# Patient Record
Sex: Male | Born: 1980
Health system: Southern US, Community
[De-identification: ages and names within clinical notes are randomized; demographics above are authoritative.]

## PROBLEM LIST (undated history)

## (undated) DIAGNOSIS — R161 Splenomegaly, not elsewhere classified: Secondary | ICD-10-CM

## (undated) DIAGNOSIS — K219 Gastro-esophageal reflux disease without esophagitis: Secondary | ICD-10-CM

## (undated) DIAGNOSIS — D696 Thrombocytopenia, unspecified: Secondary | ICD-10-CM

## (undated) DIAGNOSIS — K5790 Diverticulosis of intestine, part unspecified, without perforation or abscess without bleeding: Secondary | ICD-10-CM

## (undated) DIAGNOSIS — K589 Irritable bowel syndrome without diarrhea: Secondary | ICD-10-CM

## (undated) HISTORY — DX: Irritable bowel syndrome without diarrhea: K58.9

## (undated) HISTORY — DX: Thrombocytopenia, unspecified: D69.6

## (undated) HISTORY — DX: Splenomegaly, not elsewhere classified: R16.1

## (undated) HISTORY — PX: COSMETIC SURGERY: SHX468

## (undated) HISTORY — PX: NEVUS EXCISION: SHX2090

## (undated) HISTORY — DX: Gastro-esophageal reflux disease without esophagitis: K21.9

## (undated) HISTORY — DX: Diverticulosis of intestine, part unspecified, without perforation or abscess without bleeding: K57.90

---

## 2002-08-31 ENCOUNTER — Emergency Department (HOSPITAL_COMMUNITY): Admission: EM | Admit: 2002-08-31 | Discharge: 2002-09-01 | Payer: Self-pay | Admitting: Emergency Medicine

## 2002-09-01 ENCOUNTER — Encounter: Payer: Self-pay | Admitting: Emergency Medicine

## 2002-09-10 ENCOUNTER — Emergency Department (HOSPITAL_COMMUNITY): Admission: EM | Admit: 2002-09-10 | Discharge: 2002-09-10 | Payer: Self-pay | Admitting: Emergency Medicine

## 2006-12-15 HISTORY — PX: WISDOM TOOTH EXTRACTION: SHX21

## 2007-01-01 ENCOUNTER — Ambulatory Visit: Payer: Self-pay | Admitting: Internal Medicine

## 2007-01-02 LAB — CONVERTED CEMR LAB
LDL Cholesterol: 129 mg/dL — ABNORMAL HIGH (ref 0–99)
VLDL: 12 mg/dL (ref 0–40)

## 2009-02-17 ENCOUNTER — Ambulatory Visit: Payer: Self-pay | Admitting: Internal Medicine

## 2010-03-13 ENCOUNTER — Ambulatory Visit: Payer: Self-pay | Admitting: Internal Medicine

## 2010-03-13 DIAGNOSIS — K219 Gastro-esophageal reflux disease without esophagitis: Secondary | ICD-10-CM | POA: Insufficient documentation

## 2010-05-15 NOTE — Assessment & Plan Note (Signed)
Summary: CPX/CLE   Vital Signs:  Patient profile:   30 year old male Weight:      199 pounds BMI:     26.35 Temp:     98.0 degrees F oral Pulse rate:   72 / minute Pulse rhythm:   regular BP sitting:   100 / 60  (left arm) Cuff size:   large  Vitals Entered By: Mervin Hack CMA Duncan Dull) (March 13, 2010 8:41 AM) CC: adult physical   History of Present Illness: Here for physical  No new concerns Still at same job but new diviision  Physically active at work does try to exercise some  Allergies: No Known Drug Allergies  Past History:  Past medical, surgical, family and social histories (including risk factors) reviewed for relevance to current acute and chronic problems.  Past Medical History: GERD  Past Surgical History: Reviewed history from 01/01/2007 and no changes required. Wisdom teeth removed--9/08 Plastic surgery on face as a child--dog bite  Family History: Reviewed history from 01/01/2007 and no changes required. Mom died @53  MI Dad with CABG, DM 1 brother  1 sister CAD in mat GF also No HTN Pat GF died of some cancer  Social History: Reviewed history from 01/01/2007 and no changes required. 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 Eyes:  Denies double vision and vision loss-1 eye. ENT:  Denies decreased hearing and ringing in ears; teeth okay--generally keeps up with dentist. CV:  Denies chest pain or discomfort, difficulty breathing at night, difficulty breathing while lying down, fainting, lightheadness, palpitations, and shortness of breath with exertion. Resp:  Denies cough and shortness of breath. GI:  Complains of hemorrhoids and indigestion; denies abdominal pain, bloody stools, change in bowel habits, dark tarry stools, nausea, and vomiting; occ heartburn depending on how he eats---uses ranitidine as needed  . GU:  Denies erectile dysfunction, urinary frequency, and urinary hesitancy. MS:  Denies joint pain and joint swelling. Derm:  Denies lesion(s) and rash. Neuro:  Complains of headaches; denies numbness, tingling, and weakness; occ headaches--like once a month. Sinus meds usually help. Psych:  Denies anxiety and depression. Heme:  Denies abnormal bruising and enlarge lymph nodes. Allergy:  Denies seasonal allergies and sneezing.  Physical Exam  General:  alert and normal appearance.   Eyes:  pupils equal, pupils round, pupils reactive to light, and no optic disk abnormalities.   Ears:  R ear normal and L ear normal.   Mouth:  no erythema, no exudates, and no lesions.   Neck:  supple, no masses, no thyromegaly, no carotid bruits, and no cervical lymphadenopathy.   Lungs:  normal respiratory effort, no intercostal retractions, no accessory muscle use, and normal breath sounds.   Heart:  normal rate, regular rhythm, no murmur, and no gallop.   Abdomen:  soft, non-tender, and no masses.   Genitalia:  no scrotal masses, no testicular masses or atrophy, and no cutaneous lesions.   Msk:  no joint tenderness and no joint swelling.   Pulses:  normal in feet Extremities:  no edema Neurologic:  alert & oriented X3, strength normal in all extremities, and gait normal.   Skin:  no rashes and no suspicious lesions.   multiple benign nevi Axillary Nodes:  No palpable lymphadenopathy Psych:  normally interactive, good eye contact, not anxious appearing, and not depressed appearing.     Impression & Recommendations:  Problem # 1:  PREVENTIVE HEALTH CARE (ICD-V70.0) Assessment Comment Only healthy no labs---will check at health screening at work (and chol already okay) discussed fitness  Problem # 2:  GERD (ICD-530.81) Assessment: Unchanged does fine with occ ranitidine  Other Orders: Admin 1st Vaccine (64403) Flu Vaccine 29yrs + (47425)  Patient Instructions: 1)  Please schedule a  follow-up appointment in 1-2 years    Orders Added: 1)  Admin 1st Vaccine [90471] 2)  Flu Vaccine 56yrs + [95638] 3)  Est. Patient 18-39 years [99395]    Prior Medications: None Current Allergies (reviewed today): No known allergies Flu Vaccine Consent Questions     Do you have a history of severe allergic reactions to this vaccine? no    Any prior history of allergic reactions to egg and/or gelatin? no    Do you have a sensitivity to the preservative Thimersol? no    Do you have a past history of Guillan-Barre Syndrome? no    Do you currently have an acute febrile illness? no    Have you ever had a severe reaction to latex? no    Vaccine information given and explained to patient? yes    Are you currently pregnant? no    Lot Number:[id number]   Exp Date:10/13/2010   Site Given  Left Deltoid IMallergies     .[email or web address]

## 2011-08-20 ENCOUNTER — Other Ambulatory Visit: Payer: Self-pay

## 2011-08-26 ENCOUNTER — Encounter: Payer: Self-pay | Admitting: Internal Medicine

## 2011-08-27 ENCOUNTER — Ambulatory Visit (INDEPENDENT_AMBULATORY_CARE_PROVIDER_SITE_OTHER): Payer: BC Managed Care – PPO | Admitting: Internal Medicine

## 2011-08-27 ENCOUNTER — Encounter: Payer: Self-pay | Admitting: Internal Medicine

## 2011-08-27 VITALS — BP 118/70 | HR 66 | Temp 98.3°F | Ht 73.0 in | Wt 202.0 lb

## 2011-08-27 DIAGNOSIS — Z0000 Encounter for general adult medical examination without abnormal findings: Secondary | ICD-10-CM

## 2011-08-27 LAB — LIPID PANEL
Cholesterol: 180 mg/dL (ref 0–200)
HDL: 39.5 mg/dL (ref >39.00)
LDL Cholesterol: 123 mg/dL — ABNORMAL HIGH (ref 0–99)
Total CHOL/HDL Ratio: 5
Triglycerides: 86 mg/dL (ref 0.0–149.0)
VLDL: 17.2 mg/dL (ref 0.0–40.0)

## 2011-08-27 NOTE — Progress Notes (Signed)
Subjective:    Patient ID: Ronald Jones, male    DOB: 24-Feb-1981, 31 y.o.   MRN: [id number]  HPI Doing well Here for physical---job wants health assessment yearly  No changes in social or family history  Does have a bump on right upper thigh Noted about a year ago No sig change No symptoms  No current outpatient prescriptions on file prior to visit.    No Known Allergies  Past Medical History  Diagnosis Date  . GERD (gastroesophageal reflux disease)     Past Surgical History  Procedure Date  . Wisdom tooth extraction 09/08  . Cosmetic surgery     surgery on face as a child, dog bite    Family History  Problem Relation Age of Onset  . Heart disease Mother   . Diabetes Father   . Heart disease Father   . Coronary artery disease Maternal Grandfather   . Cancer Paternal Grandfather     History   Social History  . Marital Status: Single    Spouse Name: N/A    Number of Children: N/A  . Years of Education: N/A   Occupational History  . Not on file.   Social History Main Topics  . Smoking status: Former Smoker    Types: Cigarettes  . Smokeless tobacco: Never Used  . Alcohol Use: Yes  . Drug Use: No  . Sexually Active: Not on file   Other Topics Concern  . Not on file   Social History Narrative  . No narrative on file   Review of Systems  Constitutional: Negative for fatigue and unexpected weight change.       Swims in his pool Weight stable Wears seat belt  HENT: Negative for hearing loss, congestion, rhinorrhea, dental problem and tinnitus.        Overdue for dentist  Eyes: Negative for visual disturbance.       No diplopia or unilateral vision loss  Respiratory: Negative for cough, chest tightness and shortness of breath.   Cardiovascular: Negative for chest pain, palpitations and leg swelling.  Gastrointestinal: Negative for nausea, vomiting, abdominal pain, constipation and blood in stool.       Uses OTC ranitidine every 2 weeks or so for  heartburn  Genitourinary: Negative for dysuria, urgency, frequency and difficulty urinating.       No sexual problems  Musculoskeletal: Negative for back pain, joint swelling and arthralgias.  Skin: Negative for rash.       No suspicious lesions  Neurological: Negative for dizziness, syncope, weakness, light-headedness, numbness and headaches.  Hematological: Negative for adenopathy. Does not bruise/bleed easily.  Psychiatric/Behavioral: Negative for sleep disturbance and dysphoric mood. The patient is not nervous/anxious.        Objective:   Physical Exam  Constitutional: He is oriented to person, place, and time. He appears well-developed and well-nourished. No distress.  HENT:  Head: Normocephalic and atraumatic.  Right Ear: External ear normal.  Left Ear: External ear normal.  Mouth/Throat: Oropharynx is clear and moist. No oropharyngeal exudate.  Eyes: Conjunctivae and EOM are normal. Pupils are equal, round, and reactive to light.  Neck: Normal range of motion. Neck supple. No thyromegaly present.  Cardiovascular: Normal rate, regular rhythm, normal heart sounds and intact distal pulses.  Exam reveals no gallop.   No murmur heard. Pulmonary/Chest: Effort normal and breath sounds normal. No respiratory distress. He has no wheezes. He has no rales.  Abdominal: Soft. There is no tenderness.  Genitourinary:  Testes normal  Musculoskeletal: Normal range of motion. He exhibits no edema and no tenderness.  Lymphadenopathy:    He has no cervical adenopathy.  Neurological: He is alert and oriented to person, place, and time.  Skin: No rash noted. No erythema.       Small cyst on lateral right hip  Psychiatric: He has a normal mood and affect. His behavior is normal.          Assessment & Plan:

## 2011-08-27 NOTE — Assessment & Plan Note (Signed)
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

## 2011-08-28 ENCOUNTER — Encounter: Payer: Self-pay | Admitting: *Deleted

## 2014-06-24 LAB — LIPID PANEL
CHOLESTEROL: 173 mg/dL (ref 0–200)
HDL: 40 mg/dL (ref 35–70)
LDL CALC: 119 mg/dL
TRIGLYCERIDES: 71 mg/dL (ref 40–160)

## 2014-07-20 ENCOUNTER — Ambulatory Visit (INDEPENDENT_AMBULATORY_CARE_PROVIDER_SITE_OTHER): Payer: BLUE CROSS/BLUE SHIELD | Admitting: Internal Medicine

## 2014-07-20 ENCOUNTER — Encounter: Payer: Self-pay | Admitting: Internal Medicine

## 2014-07-20 ENCOUNTER — Encounter (INDEPENDENT_AMBULATORY_CARE_PROVIDER_SITE_OTHER): Payer: Self-pay

## 2014-07-20 VITALS — BP 120/68 | HR 79 | Temp 98.0°F | Ht 73.5 in | Wt 214.0 lb

## 2014-07-20 DIAGNOSIS — Z0000 Encounter for general adult medical examination without abnormal findings: Secondary | ICD-10-CM | POA: Diagnosis not present

## 2014-07-20 NOTE — Progress Notes (Signed)
Pre visit review using our clinic review tool, if applicable. No additional management support is needed unless otherwise documented below in the visit note. 

## 2014-07-20 NOTE — Assessment & Plan Note (Signed)
Healthy Has gained some weight Discussed fitness, etc

## 2014-07-20 NOTE — Progress Notes (Signed)
Subjective:    Patient ID: Ronald Jones, male    DOB: 1981-02-06, 34 y.o.   MRN: [id number]  HPI Here for physical  No new concerns Has baby on way in August--- son Same job  No regular exercise Walks all day at work  No current outpatient prescriptions on file prior to visit.   No current facility-administered medications on file prior to visit.    No Known Allergies  Past Medical History  Diagnosis Date  . GERD (gastroesophageal reflux disease)     Past Surgical History  Procedure Laterality Date  . Wisdom tooth extraction  09/08  . Cosmetic surgery      surgery on face as a child, dog bite  . Nevus excision      2 removed--benign    Family History  Problem Relation Age of Onset  . Heart disease Mother   . Diabetes Father   . Heart disease Father   . Coronary artery disease Maternal Grandfather   . Cancer Paternal Grandfather     History   Social History  . Marital Status: Married    Spouse Name: N/A  . Number of Children: N/A  . Years of Education: N/A   Occupational History  . Mechanic    Social History Main Topics  . Smoking status: Former Smoker    Types: Cigarettes  . Smokeless tobacco: Never Used  . Alcohol Use: Yes  . Drug Use: No  . Sexual Activity: Not on file   Other Topics Concern  . Not on file   Social History Narrative   Review of Systems  Constitutional: Positive for unexpected weight change. Negative for fatigue.       Wears seat belt Weight up 12#  HENT: Negative for dental problem, hearing loss, tinnitus and trouble swallowing.        Overdo for dentist  Eyes: Negative for visual disturbance.       No diplopia or unilateral vision loss  Respiratory: Negative for cough, chest tightness and shortness of breath.   Cardiovascular: Negative for chest pain, palpitations and leg swelling.  Gastrointestinal: Negative for nausea, vomiting, abdominal pain, constipation and blood in stool.       Occasional  indigestion---ranitidine works  Endocrine: Negative for polydipsia and polyuria.  Genitourinary: Negative for urgency, frequency and difficulty urinating.  Musculoskeletal: Negative for joint swelling.       Occasional soreness from work--not enough for meds  Skin: Negative for rash.       Nevi removed  Allergic/Immunologic: Negative for environmental allergies and immunocompromised state.  Neurological: Negative for dizziness, syncope, weakness, light-headedness, numbness and headaches.  Psychiatric/Behavioral: Negative for dysphoric mood. The patient is not nervous/anxious.        Objective:   Physical Exam  Constitutional: He is oriented to person, place, and time. He appears well-developed and well-nourished. No distress.  HENT:  Head: Normocephalic and atraumatic.  Right Ear: External ear normal.  Left Ear: External ear normal.  Mouth/Throat: Oropharynx is clear and moist. No oropharyngeal exudate.  Eyes: Conjunctivae and EOM are normal. Pupils are equal, round, and reactive to light.  Neck: Normal range of motion. Neck supple. No thyromegaly present.  Cardiovascular: Normal rate, regular rhythm, normal heart sounds and intact distal pulses.  Exam reveals no gallop.   No murmur heard. Pulmonary/Chest: Effort normal and breath sounds normal. No respiratory distress. He has no wheezes. He has no rales.  Abdominal: Soft. There is no tenderness.  Musculoskeletal: He exhibits no edema  or tenderness.  Lymphadenopathy:    He has no cervical adenopathy.  Neurological: He is alert and oriented to person, place, and time.  Skin: No rash noted. No erythema.  Psychiatric: He has a normal mood and affect. His behavior is normal.          Assessment & Plan:

## 2015-04-12 ENCOUNTER — Ambulatory Visit (INDEPENDENT_AMBULATORY_CARE_PROVIDER_SITE_OTHER): Payer: BLUE CROSS/BLUE SHIELD | Admitting: Internal Medicine

## 2015-04-12 ENCOUNTER — Encounter: Payer: Self-pay | Admitting: *Deleted

## 2015-04-12 ENCOUNTER — Encounter: Payer: Self-pay | Admitting: Internal Medicine

## 2015-04-12 VITALS — BP 120/80 | HR 88 | Temp 98.0°F | Wt 213.0 lb

## 2015-04-12 DIAGNOSIS — J0100 Acute maxillary sinusitis, unspecified: Secondary | ICD-10-CM

## 2015-04-12 MED ORDER — AMOXICILLIN 500 MG PO TABS: 1000.0000 mg | ORAL_TABLET | Freq: Two times a day (BID) | ORAL | Status: DC

## 2015-04-12 NOTE — Patient Instructions (Signed)
Please start the antibiotic if you are worsening in the next few days. 

## 2015-04-12 NOTE — Progress Notes (Signed)
   Subjective:    Patient ID: Ronald Jones, male    DOB: 01-09-1981, 34 y.o.   MRN: [id number]  HPI Here due to cough  Throat is very painful Lots of mucus in throat--post nasal drip Some cough---green/yellow sputum Started about 5 days ago No clear fever but he felt feverish--cold then sweaty 3 days ago No SOB Some right ear pain yesterday Some headache at first  Tried OTC mucinex cold and sinus-- may have helped a little  No current outpatient prescriptions on file prior to visit.   No current facility-administered medications on file prior to visit.    No Known Allergies  Past Medical History  Diagnosis Date  . GERD (gastroesophageal reflux disease)     Past Surgical History  Procedure Laterality Date  . Wisdom tooth extraction  09/08  . Cosmetic surgery      surgery on face as a child, dog bite  . Nevus excision      2 removed--benign    Family History  Problem Relation Age of Onset  . Heart disease Mother   . Diabetes Father   . Heart disease Father   . Coronary artery disease Maternal Grandfather   . Cancer Paternal Grandfather     Social History   Social History  . Marital Status: Married    Spouse Name: N/A  . Number of Children: N/A  . Years of Education: N/A   Occupational History  . Mechanic    Social History Main Topics  . Smoking status: Former Smoker    Types: Cigarettes  . Smokeless tobacco: Never Used  . Alcohol Use: Yes  . Drug Use: No  . Sexual Activity: Not on file   Other Topics Concern  . Not on file   Social History Narrative   Review of Systems No known strep exposure No rash Vomited and loose stool 3 days ago--better since Appetite is okay    Objective:   Physical Exam  Constitutional: No distress.  HENT:  Bilateral mild maxillary tenderness Moderate nasal inflammation TMs normal Mild pharyngeal injection. No tonsillar enlargement or exudates  Neck: Normal range of motion. Neck supple.  Small non tender  anterior cervical nodes  Pulmonary/Chest: Effort normal and breath sounds normal. No respiratory distress. He has no wheezes. He has no rales.          Assessment & Plan:

## 2015-04-12 NOTE — Assessment & Plan Note (Signed)
May still be viral infection Discussed supportive care If worsens, will fill the amoxil Rx

## 2015-07-06 ENCOUNTER — Ambulatory Visit (INDEPENDENT_AMBULATORY_CARE_PROVIDER_SITE_OTHER): Payer: BLUE CROSS/BLUE SHIELD | Admitting: Internal Medicine

## 2015-07-06 ENCOUNTER — Telehealth: Payer: Self-pay | Admitting: *Deleted

## 2015-07-06 ENCOUNTER — Telehealth: Payer: Self-pay | Admitting: Internal Medicine

## 2015-07-06 ENCOUNTER — Encounter: Payer: Self-pay | Admitting: Internal Medicine

## 2015-07-06 VITALS — BP 126/88 | HR 111 | Temp 98.2°F | Wt 207.0 lb

## 2015-07-06 DIAGNOSIS — A084 Viral intestinal infection, unspecified: Secondary | ICD-10-CM | POA: Diagnosis not present

## 2015-07-06 LAB — COMPREHENSIVE METABOLIC PANEL
ALK PHOS: 55 U/L (ref 39–117)
ALT: 37 U/L (ref 0–53)
AST: 30 U/L (ref 0–37)
Albumin: 4.6 g/dL (ref 3.5–5.2)
BILIRUBIN TOTAL: 1.3 mg/dL — AB (ref 0.2–1.2)
BUN: 18 mg/dL (ref 6–23)
CO2: 24 meq/L (ref 19–32)
CREATININE: 1.28 mg/dL (ref 0.40–1.50)
Calcium: 9.2 mg/dL (ref 8.4–10.5)
Chloride: 99 mEq/L (ref 96–112)
GFR: 67.93 mL/min (ref >60.00)
GLUCOSE: 107 mg/dL — AB (ref 70–99)
Potassium: 2.7 mEq/L — CL (ref 3.5–5.1)
Sodium: 134 mEq/L — ABNORMAL LOW (ref 135–145)
TOTAL PROTEIN: 7.5 g/dL (ref 6.0–8.3)

## 2015-07-06 LAB — CBC
HCT: 44.1 % (ref 39.0–52.0)
HEMOGLOBIN: 15.3 g/dL (ref 13.0–17.0)
MCHC: 34.8 g/dL (ref 30.0–36.0)
MCV: 84.8 fl (ref 78.0–100.0)
PLATELETS: 139 10*3/uL — AB (ref 150.0–400.0)
RBC: 5.2 Mil/uL (ref 4.22–5.81)
RDW: 14.1 % (ref 11.5–15.5)
WBC: 8.8 10*3/uL (ref 4.0–10.5)

## 2015-07-06 NOTE — Progress Notes (Signed)
Pre visit review using our clinic review tool, if applicable. No additional management support is needed unless otherwise documented below in the visit note. 

## 2015-07-06 NOTE — Telephone Encounter (Signed)
noted 

## 2015-07-06 NOTE — Progress Notes (Signed)
Subjective:    Patient ID: Ronald Jones, male    DOB: 02/15/1981, 35 y.o.   MRN: [id number]  HPI  Pt presents to the clinic today with c/o nausea, vomiting and diarrhea. He reports this started 3 days ago. He has not vomited in the last 2 days. He continues to have loose, watery stools. He does have some associated left lower abdominal discomfort but denies fever or blood in his stool. He has had some chills. He has not had contact with anyone with similar symptoms. He denies recent travel or consuming contaminated food and water. He denies changes in diet or medications. He has taken Zofran with minimal relief.  Review of Systems      Past Medical History  Diagnosis Date  . GERD (gastroesophageal reflux disease)     Current Outpatient Prescriptions  Medication Sig Dispense Refill  . ondansetron (ZOFRAN) 8 MG tablet   0   No current facility-administered medications for this visit.    No Known Allergies  Family History  Problem Relation Age of Onset  . Heart disease Mother   . Diabetes Father   . Heart disease Father   . Coronary artery disease Maternal Grandfather   . Cancer Paternal Grandfather     Social History   Social History  . Marital Status: Married    Spouse Name: N/A  . Number of Children: 1  . Years of Education: N/A   Occupational History  . Mechanic    Social History Main Topics  . Smoking status: Former Smoker    Types: Cigarettes  . Smokeless tobacco: Never Used  . Alcohol Use: Yes  . Drug Use: No  . Sexual Activity: Not on file   Other Topics Concern  . Not on file   Social History Narrative   Son born 2016     Constitutional: Denies fever, malaise, fatigue, headache or abrupt weight changes.  Respiratory: Denies difficulty breathing, shortness of breath, cough or sputum production.   Cardiovascular: Denies chest pain, chest tightness, palpitations or swelling in the hands or feet.  Gastrointestinal: Pt reports abdominal pain, nausea,  vomiting and diarrhea. Denies bloating, constipation, or blood in the stool.   No other specific complaints in a complete review of systems (except as listed in HPI above).  Objective:   Physical Exam  BP 126/88 mmHg  Pulse 111  Temp(Src) 98.2 F (36.8 C) (Oral)  Wt 207 lb (93.895 kg)  SpO2 98% Wt Readings from Last 3 Encounters:  07/06/15 207 lb (93.895 kg)  04/12/15 213 lb (96.616 kg)  07/20/14 214 lb (97.07 kg)    General: Appears his stated age, in NAD. Cardiovascular: Tachycardic with normal rhythm. S1,S2 noted.  No murmur, rubs or gallops noted. Pulmonary/Chest: Normal effort and positive vesicular breath sounds. No respiratory distress. No wheezes, rales or ronchi noted.  Abdomen: Soft and nontender. Hypoactive bowel sounds. No distention or masses noted.   BMET    Component Value Date/Time   GLUCOSE 84 08/27/2011 1235    Lipid Panel     Component Value Date/Time   CHOL 173 06/24/2014   TRIG 71 06/24/2014   HDL 40 06/24/2014   CHOLHDL 5 08/27/2011 1235   VLDL 17.2 08/27/2011 1235   LDLCALC 119 06/24/2014    CBC No results found for: WBC, RBC, HGB, HCT, PLT, MCV, MCH, MCHC, RDW, LYMPHSABS, MONOABS, EOSABS, BASOSABS  Hgb A1C No results found for: HGBA1C       Assessment & Plan:   Viral  Gastroenteritis:  Low suspicion for diverticulitis given age, lack of fever or abdominal pain at this time Will check CBC and CMET today Clear liquid diet for now, progress as symptoms improve Ok to continue Zofran as needed Avoid antidiarrheals Work note provided  RTC as needed or if symptoms persist or worsen

## 2015-07-06 NOTE — Telephone Encounter (Signed)
Received call from [facility] lab with critical results. Potassium was 2.7. Hardcopy of labs given to [facility].

## 2015-07-06 NOTE — Patient Instructions (Signed)

## 2015-07-06 NOTE — Telephone Encounter (Signed)
Patient called to find out his lab results.  I let patient know Regina's comments.  He is on my chart, but the labs weren't released.

## 2016-12-19 ENCOUNTER — Encounter: Payer: Self-pay | Admitting: Internal Medicine

## 2016-12-19 ENCOUNTER — Ambulatory Visit (INDEPENDENT_AMBULATORY_CARE_PROVIDER_SITE_OTHER): Payer: BLUE CROSS/BLUE SHIELD | Admitting: Internal Medicine

## 2016-12-19 VITALS — BP 112/70 | HR 80 | Temp 97.5°F | Ht 73.0 in | Wt 208.0 lb

## 2016-12-19 DIAGNOSIS — K589 Irritable bowel syndrome without diarrhea: Secondary | ICD-10-CM | POA: Insufficient documentation

## 2016-12-19 DIAGNOSIS — K580 Irritable bowel syndrome with diarrhea: Secondary | ICD-10-CM

## 2016-12-19 DIAGNOSIS — Z0000 Encounter for general adult medical examination without abnormal findings: Secondary | ICD-10-CM | POA: Diagnosis not present

## 2016-12-19 DIAGNOSIS — Z23 Encounter for immunization: Secondary | ICD-10-CM | POA: Diagnosis not present

## 2016-12-19 MED ORDER — HYOSCYAMINE SULFATE 0.125 MG SL SUBL: 0.1250 mg | SUBLINGUAL_TABLET | Freq: Three times a day (TID) | SUBLINGUAL | 5 refills | Status: DC | PRN

## 2016-12-19 NOTE — Addendum Note (Signed)
Addended by: Pilar Grammes on: 12/19/2016 03:37 PM   Modules accepted: Orders

## 2016-12-19 NOTE — Assessment & Plan Note (Signed)
Not overly troubling Will give hyoscyamine to try when going out

## 2016-12-19 NOTE — Assessment & Plan Note (Signed)
Healthy Will check lipid Discussed fitness Tdap today

## 2016-12-19 NOTE — Progress Notes (Signed)
Subjective:    Patient ID: Ronald Jones, male    DOB: 1980/10/18, 36 y.o.   MRN: [id number]  HPI Here for physical  Feels well Son is now 22 years old  No concerns  No current outpatient prescriptions on file prior to visit.   No current facility-administered medications on file prior to visit.     No Known Allergies  Past Medical History:  Diagnosis Date  . GERD (gastroesophageal reflux disease)     Past Surgical History:  Procedure Laterality Date  . COSMETIC SURGERY     surgery on face as a child, dog bite  . NEVUS EXCISION     2 removed--benign  . WISDOM TOOTH EXTRACTION  09/08    Family History  Problem Relation Age of Onset  . Heart disease Mother   . Diabetes Father   . Heart disease Father   . Coronary artery disease Maternal Grandfather   . Cancer Paternal Grandfather     Social History   Social History  . Marital status: Married    Spouse name: N/A  . Number of children: 1  . Years of education: N/A   Occupational History  . Mechanic    Social History Main Topics  . Smoking status: Former Smoker    Types: Cigarettes  . Smokeless tobacco: Never Used  . Alcohol use Yes  . Drug use: No  . Sexual activity: Not on file   Other Topics Concern  . Not on file   Social History Narrative   Son born 2016   Review of Systems  Constitutional: Negative for fatigue and unexpected weight change.       No recent work outs  Wears seat belt  HENT: Negative for dental problem, hearing loss and tinnitus.        Overdue for dentist  Eyes: Negative for visual disturbance.       No diplopia or unilateral vision loss  Respiratory: Negative for cough, chest tightness and shortness of breath.   Cardiovascular: Negative for chest pain, palpitations and leg swelling.  Gastrointestinal: Negative for abdominal pain and blood in stool.       Gets indigestion and irritable bowel---loose, frequent stools at times  Endocrine: Negative for polydipsia and polyuria.    Genitourinary: Negative for difficulty urinating and urgency.       No sexual problems  Musculoskeletal: Negative for arthralgias and joint swelling.       Uses aleve for back at times  Skin: Negative for rash.  Allergic/Immunologic: Positive for environmental allergies. Negative for immunocompromised state.       Rare zyrtec  Neurological: Negative for dizziness, syncope and light-headedness.       Rare headaches  Hematological: Negative for adenopathy. Does not bruise/bleed easily.  Psychiatric/Behavioral: Negative for dysphoric mood and sleep disturbance. The patient is not nervous/anxious.        Objective:   Physical Exam  Constitutional: He is oriented to person, place, and time. He appears well-developed and well-nourished. No distress.  HENT:  Head: Normocephalic and atraumatic.  Right Ear: External ear normal.  Left Ear: External ear normal.  Mouth/Throat: Oropharynx is clear and moist. No oropharyngeal exudate.  Eyes: Pupils are equal, round, and reactive to light. Conjunctivae are normal.  Neck: Normal range of motion. Neck supple. No thyromegaly present.  Cardiovascular: Normal rate, regular rhythm, normal heart sounds and intact distal pulses.  Exam reveals no gallop.   No murmur heard. Pulmonary/Chest: Effort normal and breath sounds normal. No  respiratory distress. He has no wheezes. He has no rales.  Abdominal: Soft. He exhibits no distension. There is no tenderness. There is no rebound and no guarding.  Musculoskeletal: He exhibits no edema or tenderness.  Lymphadenopathy:    He has no cervical adenopathy.  Neurological: He is alert and oriented to person, place, and time.  Skin: No rash noted. No erythema.  Psychiatric: He has a normal mood and affect. His behavior is normal.          Assessment & Plan:

## 2016-12-20 LAB — CBC WITH DIFFERENTIAL/PLATELET
BASOS PCT: 0.5 % (ref 0.0–3.0)
Basophils Absolute: 0 10*3/uL (ref 0.0–0.1)
EOS ABS: 0.1 10*3/uL (ref 0.0–0.7)
Eosinophils Relative: 2.5 % (ref 0.0–5.0)
HCT: 42.2 % (ref 39.0–52.0)
Hemoglobin: 14.4 g/dL (ref 13.0–17.0)
Lymphocytes Relative: 31 % (ref 12.0–46.0)
Lymphs Abs: 1.6 10*3/uL (ref 0.7–4.0)
MCHC: 34 g/dL (ref 30.0–36.0)
MCV: 88 fl (ref 78.0–100.0)
MONO ABS: 0.5 10*3/uL (ref 0.1–1.0)
Monocytes Relative: 9.2 % (ref 3.0–12.0)
NEUTROS ABS: 3 10*3/uL (ref 1.4–7.7)
Neutrophils Relative %: 56.8 % (ref 43.0–77.0)
PLATELETS: 152 10*3/uL (ref 150.0–400.0)
RBC: 4.79 Mil/uL (ref 4.22–5.81)
RDW: 13.9 % (ref 11.5–15.5)
WBC: 5.2 10*3/uL (ref 4.0–10.5)

## 2016-12-20 LAB — COMPREHENSIVE METABOLIC PANEL
ALBUMIN: 4.9 g/dL (ref 3.5–5.2)
ALT: 21 U/L (ref 0–53)
AST: 19 U/L (ref 0–37)
Alkaline Phosphatase: 52 U/L (ref 39–117)
BUN: 16 mg/dL (ref 6–23)
CHLORIDE: 106 meq/L (ref 96–112)
CO2: 25 meq/L (ref 19–32)
CREATININE: 1.23 mg/dL (ref 0.40–1.50)
Calcium: 9.4 mg/dL (ref 8.4–10.5)
GFR: 70.54 mL/min (ref >60.00)
Glucose, Bld: 85 mg/dL (ref 70–99)
Potassium: 3.8 mEq/L (ref 3.5–5.1)
SODIUM: 140 meq/L (ref 135–145)
Total Bilirubin: 1.3 mg/dL — ABNORMAL HIGH (ref 0.2–1.2)
Total Protein: 7.1 g/dL (ref 6.0–8.3)

## 2016-12-20 LAB — LIPID PANEL
CHOLESTEROL: 175 mg/dL (ref 0–200)
HDL: 34.7 mg/dL — ABNORMAL LOW (ref >39.00)
LDL Cholesterol: 106 mg/dL — ABNORMAL HIGH (ref 0–99)
NonHDL: 140.78
TRIGLYCERIDES: 173 mg/dL — AB (ref 0.0–149.0)
Total CHOL/HDL Ratio: 5
VLDL: 34.6 mg/dL (ref 0.0–40.0)

## 2018-01-07 ENCOUNTER — Ambulatory Visit (INDEPENDENT_AMBULATORY_CARE_PROVIDER_SITE_OTHER): Payer: Commercial Managed Care - PPO | Admitting: Internal Medicine

## 2018-01-07 ENCOUNTER — Encounter: Payer: Self-pay | Admitting: Internal Medicine

## 2018-01-07 VITALS — BP 122/80 | HR 66 | Temp 97.6°F | Ht 73.0 in | Wt 217.0 lb

## 2018-01-07 DIAGNOSIS — M9903 Segmental and somatic dysfunction of lumbar region: Secondary | ICD-10-CM | POA: Diagnosis not present

## 2018-01-07 DIAGNOSIS — K580 Irritable bowel syndrome with diarrhea: Secondary | ICD-10-CM

## 2018-01-07 DIAGNOSIS — M5127 Other intervertebral disc displacement, lumbosacral region: Secondary | ICD-10-CM | POA: Diagnosis not present

## 2018-01-07 DIAGNOSIS — Z0000 Encounter for general adult medical examination without abnormal findings: Secondary | ICD-10-CM

## 2018-01-07 LAB — LIPID PANEL
Cholesterol: 150 mg/dL (ref 0–200)
HDL: 36 mg/dL — AB (ref >39.00)
LDL Cholesterol: 89 mg/dL (ref 0–99)
NonHDL: 113.83
TRIGLYCERIDES: 124 mg/dL (ref 0.0–149.0)
Total CHOL/HDL Ratio: 4
VLDL: 24.8 mg/dL (ref 0.0–40.0)

## 2018-01-07 LAB — GLUCOSE, RANDOM: Glucose, Bld: 93 mg/dL (ref 70–99)

## 2018-01-07 NOTE — Progress Notes (Signed)
Subjective:    Patient ID: Ronald Jones, male    DOB: Nov 26, 1980, 37 y.o.   MRN: [id number]  HPI Here for physical  No health concerns Has gained a few pounds----busy with child, etc Not much exercising Tries to be careful with diet  No current outpatient medications on file prior to visit.   No current facility-administered medications on file prior to visit.     No Known Allergies  Past Medical History:  Diagnosis Date  . GERD (gastroesophageal reflux disease)     Past Surgical History:  Procedure Laterality Date  . COSMETIC SURGERY     surgery on face as a child, dog bite  . NEVUS EXCISION     2 removed--benign  . WISDOM TOOTH EXTRACTION  09/08    Family History  Problem Relation Age of Onset  . Heart disease Mother   . Diabetes Father   . Heart disease Father   . Coronary artery disease Maternal Grandfather   . Cancer Paternal Grandfather     Social History   Socioeconomic History  . Marital status: Married    Spouse name: Not on file  . Number of children: 1  . Years of education: Not on file  . Highest education level: Not on file  Occupational History  . Occupation: Manufacturing systems engineer  . Financial resource strain: Not on file  . Food insecurity:    Worry: Not on file    Inability: Not on file  . Transportation needs:    Medical: Not on file    Non-medical: Not on file  Tobacco Use  . Smoking status: Former Smoker    Types: Cigarettes  . Smokeless tobacco: Never Used  Substance and Sexual Activity  . Alcohol use: Yes  . Drug use: No  . Sexual activity: Not on file  Lifestyle  . Physical activity:    Days per week: Not on file    Minutes per session: Not on file  . Stress: Not on file  Relationships  . Social connections:    Talks on phone: Not on file    Gets together: Not on file    Attends religious service: Not on file    Active member of club or organization: Not on file    Attends meetings of clubs or organizations: Not on  file    Relationship status: Not on file  . Intimate partner violence:    Fear of current or ex partner: Not on file    Emotionally abused: Not on file    Physically abused: Not on file    Forced sexual activity: Not on file  Other Topics Concern  . Not on file  Social History Narrative   Son born 2016   Review of Systems  Constitutional: Negative for fatigue.       Wears seat  belt  HENT: Negative for dental problem, hearing loss, tinnitus and trouble swallowing.        Now back with the dentist  Eyes: Negative for visual disturbance.       No diplopia or unilateral vision loss  Respiratory: Negative for cough, chest tightness and shortness of breath.   Cardiovascular: Negative for chest pain, palpitations and leg swelling.  Gastrointestinal: Negative for abdominal pain, blood in stool and constipation.       Occ heartburn-- zantac prn  Endocrine: Negative for polydipsia and polyuria.  Genitourinary: Negative for difficulty urinating and urgency.       No sexual problems  Musculoskeletal: Negative for arthralgias and joint swelling.       Some back pain---just started with chiropractor (does use aleve)  Skin: Negative for rash.  Allergic/Immunologic: Negative for environmental allergies and immunocompromised state.  Neurological: Negative for dizziness, syncope and light-headedness.       Rare headaches--tylenol helps  Psychiatric/Behavioral: Negative for dysphoric mood and sleep disturbance. The patient is not nervous/anxious.        Objective:   Physical Exam  Constitutional: He is oriented to person, place, and time. He appears well-developed. No distress.  HENT:  Head: Normocephalic and atraumatic.  Right Ear: External ear normal.  Left Ear: External ear normal.  Mouth/Throat: Oropharynx is clear and moist. No oropharyngeal exudate.  Eyes: Pupils are equal, round, and reactive to light. Conjunctivae are normal.  Neck: No thyromegaly present.  Cardiovascular: Normal  rate, regular rhythm, normal heart sounds and intact distal pulses. Exam reveals no gallop.  No murmur heard. Respiratory: Effort normal and breath sounds normal. No respiratory distress. He has no wheezes. He has no rales.  GI: Soft. There is no tenderness.  Musculoskeletal: He exhibits no edema or tenderness.  Lymphadenopathy:    He has no cervical adenopathy.  Neurological: He is alert and oriented to person, place, and time.  Skin: No rash noted. No erythema.  Psychiatric: He has a normal mood and affect. His behavior is normal.           Assessment & Plan:

## 2018-01-07 NOTE — Patient Instructions (Signed)
DASH Eating Plan DASH stands for "Dietary Approaches to Stop Hypertension." The DASH eating plan is a healthy eating plan that has been shown to reduce high blood pressure (hypertension). It may also reduce your risk for type 2 diabetes, heart disease, and stroke. The DASH eating plan may also help with weight loss. What are tips for following this plan? General guidelines  Avoid eating more than 2,300 mg (milligrams) of salt (sodium) a day. If you have hypertension, you may need to reduce your sodium intake to 1,500 mg a day.  Limit alcohol intake to no more than 1 drink a day for nonpregnant women and 2 drinks a day for men. One drink equals 12 oz of beer, 5 oz of wine, or 1 oz of hard liquor.  Work with your health care provider to maintain a healthy body weight or to lose weight. Ask what an ideal weight is for you.  Get at least 30 minutes of exercise that causes your heart to beat faster (aerobic exercise) most days of the week. Activities may include walking, swimming, or biking.  Work with your health care provider or diet and nutrition specialist (dietitian) to adjust your eating plan to your individual calorie needs. Reading food labels  Check food labels for the amount of sodium per serving. Choose foods with less than 5 percent of the Daily Value of sodium. Generally, foods with less than 300 mg of sodium per serving fit into this eating plan.  To find whole grains, look for the word "whole" as the first word in the ingredient list. Shopping  Buy products labeled as "low-sodium" or "no salt added."  Buy fresh foods. Avoid canned foods and premade or frozen meals. Cooking  Avoid adding salt when cooking. Use salt-free seasonings or herbs instead of table salt or sea salt. Check with your health care provider or pharmacist before using salt substitutes.  Do not fry foods. Cook foods using healthy methods such as baking, boiling, grilling, and broiling instead.  Cook with  heart-healthy oils, such as olive, canola, soybean, or sunflower oil. Meal planning   Eat a balanced diet that includes: ? 5 or more servings of fruits and vegetables each day. At each meal, try to fill half of your plate with fruits and vegetables. ? Up to 6-8 servings of whole grains each day. ? Less than 6 oz of lean meat, poultry, or fish each day. A 3-oz serving of meat is about the same size as a deck of cards. One egg equals 1 oz. ? 2 servings of low-fat dairy each day. ? A serving of nuts, seeds, or beans 5 times each week. ? Heart-healthy fats. Healthy fats called Omega-3 fatty acids are found in foods such as flaxseeds and coldwater fish, like sardines, salmon, and mackerel.  Limit how much you eat of the following: ? Canned or prepackaged foods. ? Food that is high in trans fat, such as fried foods. ? Food that is high in saturated fat, such as fatty meat. ? Sweets, desserts, sugary drinks, and other foods with added sugar. ? Full-fat dairy products.  Do not salt foods before eating.  Try to eat at least 2 vegetarian meals each week.  Eat more home-cooked food and less restaurant, buffet, and fast food.  When eating at a restaurant, ask that your food be prepared with less salt or no salt, if possible. What foods are recommended? The items listed may not be a complete list. Talk with your dietitian about what   dietary choices are best for you. Grains Whole-grain or whole-wheat bread. Whole-grain or whole-wheat pasta. Brown rice. Oatmeal. Quinoa. Bulgur. Whole-grain and low-sodium cereals. Pita bread. Low-fat, low-sodium crackers. Whole-wheat flour tortillas. Vegetables Fresh or frozen vegetables (raw, steamed, roasted, or grilled). Low-sodium or reduced-sodium tomato and vegetable juice. Low-sodium or reduced-sodium tomato sauce and tomato paste. Low-sodium or reduced-sodium canned vegetables. Fruits All fresh, dried, or frozen fruit. Canned fruit in natural juice (without  added sugar). Meat and other protein foods Skinless chicken or turkey. Ground chicken or turkey. Pork with fat trimmed off. Fish and seafood. Egg whites. Dried beans, peas, or lentils. Unsalted nuts, nut butters, and seeds. Unsalted canned beans. Lean cuts of beef with fat trimmed off. Low-sodium, lean deli meat. Dairy Low-fat (1%) or fat-free (skim) milk. Fat-free, low-fat, or reduced-fat cheeses. Nonfat, low-sodium ricotta or cottage cheese. Low-fat or nonfat yogurt. Low-fat, low-sodium cheese. Fats and oils Soft margarine without trans fats. Vegetable oil. Low-fat, reduced-fat, or light mayonnaise and salad dressings (reduced-sodium). Canola, safflower, olive, soybean, and sunflower oils. Avocado. Seasoning and other foods Herbs. Spices. Seasoning mixes without salt. Unsalted popcorn and pretzels. Fat-free sweets. What foods are not recommended? The items listed may not be a complete list. Talk with your dietitian about what dietary choices are best for you. Grains Baked goods made with fat, such as croissants, muffins, or some breads. Dry pasta or rice meal packs. Vegetables Creamed or fried vegetables. Vegetables in a cheese sauce. Regular canned vegetables (not low-sodium or reduced-sodium). Regular canned tomato sauce and paste (not low-sodium or reduced-sodium). Regular tomato and vegetable juice (not low-sodium or reduced-sodium). Pickles. Olives. Fruits Canned fruit in a light or heavy syrup. Fried fruit. Fruit in cream or butter sauce. Meat and other protein foods Fatty cuts of meat. Ribs. Fried meat. Bacon. Sausage. Bologna and other processed lunch meats. Salami. Fatback. Hotdogs. Bratwurst. Salted nuts and seeds. Canned beans with added salt. Canned or smoked fish. Whole eggs or egg yolks. Chicken or turkey with skin. Dairy Whole or 2% milk, cream, and half-and-half. Whole or full-fat cream cheese. Whole-fat or sweetened yogurt. Full-fat cheese. Nondairy creamers. Whipped toppings.  Processed cheese and cheese spreads. Fats and oils Butter. Stick margarine. Lard. Shortening. Ghee. Bacon fat. Tropical oils, such as coconut, palm kernel, or palm oil. Seasoning and other foods Salted popcorn and pretzels. Onion salt, garlic salt, seasoned salt, table salt, and sea salt. Worcestershire sauce. Tartar sauce. Barbecue sauce. Teriyaki sauce. Soy sauce, including reduced-sodium. Steak sauce. Canned and packaged gravies. Fish sauce. Oyster sauce. Cocktail sauce. Horseradish that you find on the shelf. Ketchup. Mustard. Meat flavorings and tenderizers. Bouillon cubes. Hot sauce and Tabasco sauce. Premade or packaged marinades. Premade or packaged taco seasonings. Relishes. Regular salad dressings. Where to find more information:  National Heart, Lung, and Blood Institute: www.nhlbi.nih.gov  American Heart Association: www.heart.org Summary  The DASH eating plan is a healthy eating plan that has been shown to reduce high blood pressure (hypertension). It may also reduce your risk for type 2 diabetes, heart disease, and stroke.  With the DASH eating plan, you should limit salt (sodium) intake to 2,300 mg a day. If you have hypertension, you may need to reduce your sodium intake to 1,500 mg a day.  When on the DASH eating plan, aim to eat more fresh fruits and vegetables, whole grains, lean proteins, low-fat dairy, and heart-healthy fats.  Work with your health care provider or diet and nutrition specialist (dietitian) to adjust your eating plan to your individual   calorie needs. This information is not intended to replace advice given to you by your health care provider. Make sure you discuss any questions you have with your health care provider. Document Released: 03/21/2011 Document Revised: 03/25/2016 Document Reviewed: 03/25/2016 Elsevier Interactive Patient Education  2018 Elsevier Inc.  

## 2018-01-07 NOTE — Assessment & Plan Note (Signed)
Only occasional symptoms Medication didn't really do anything for him

## 2018-01-07 NOTE — Assessment & Plan Note (Signed)
Healthy Discussed fitness--did gain some weight DASH info Will check lipid/glucose Getting flu vaccine at work

## 2018-01-08 DIAGNOSIS — M5127 Other intervertebral disc displacement, lumbosacral region: Secondary | ICD-10-CM | POA: Diagnosis not present

## 2018-01-08 DIAGNOSIS — M9903 Segmental and somatic dysfunction of lumbar region: Secondary | ICD-10-CM | POA: Diagnosis not present

## 2018-01-13 DIAGNOSIS — M9903 Segmental and somatic dysfunction of lumbar region: Secondary | ICD-10-CM | POA: Diagnosis not present

## 2018-01-13 DIAGNOSIS — M5127 Other intervertebral disc displacement, lumbosacral region: Secondary | ICD-10-CM | POA: Diagnosis not present

## 2018-01-14 DIAGNOSIS — M9903 Segmental and somatic dysfunction of lumbar region: Secondary | ICD-10-CM | POA: Diagnosis not present

## 2018-01-14 DIAGNOSIS — M5127 Other intervertebral disc displacement, lumbosacral region: Secondary | ICD-10-CM | POA: Diagnosis not present

## 2018-01-20 DIAGNOSIS — M9903 Segmental and somatic dysfunction of lumbar region: Secondary | ICD-10-CM | POA: Diagnosis not present

## 2018-01-20 DIAGNOSIS — M5127 Other intervertebral disc displacement, lumbosacral region: Secondary | ICD-10-CM | POA: Diagnosis not present

## 2018-01-22 DIAGNOSIS — M5127 Other intervertebral disc displacement, lumbosacral region: Secondary | ICD-10-CM | POA: Diagnosis not present

## 2018-01-22 DIAGNOSIS — M9903 Segmental and somatic dysfunction of lumbar region: Secondary | ICD-10-CM | POA: Diagnosis not present

## 2018-01-29 DIAGNOSIS — M5127 Other intervertebral disc displacement, lumbosacral region: Secondary | ICD-10-CM | POA: Diagnosis not present

## 2018-01-29 DIAGNOSIS — M9903 Segmental and somatic dysfunction of lumbar region: Secondary | ICD-10-CM | POA: Diagnosis not present

## 2018-12-22 ENCOUNTER — Ambulatory Visit (INDEPENDENT_AMBULATORY_CARE_PROVIDER_SITE_OTHER): Payer: Commercial Managed Care - PPO | Admitting: Internal Medicine

## 2018-12-22 ENCOUNTER — Encounter: Payer: Self-pay | Admitting: Internal Medicine

## 2018-12-22 ENCOUNTER — Other Ambulatory Visit: Payer: Self-pay

## 2018-12-22 VITALS — BP 122/82 | HR 80 | Temp 98.7°F | Ht 73.0 in | Wt 218.1 lb

## 2018-12-22 DIAGNOSIS — Z0000 Encounter for general adult medical examination without abnormal findings: Secondary | ICD-10-CM

## 2018-12-22 DIAGNOSIS — Z23 Encounter for immunization: Secondary | ICD-10-CM | POA: Diagnosis not present

## 2018-12-22 LAB — LIPID PANEL
Cholesterol: 168 mg/dL (ref 0–200)
HDL: 35.5 mg/dL — ABNORMAL LOW (ref >39.00)
LDL Cholesterol: 109 mg/dL — ABNORMAL HIGH (ref 0–99)
NonHDL: 132.86
Total CHOL/HDL Ratio: 5
Triglycerides: 117 mg/dL (ref 0.0–149.0)
VLDL: 23.4 mg/dL (ref 0.0–40.0)

## 2018-12-22 LAB — GLUCOSE, RANDOM: Glucose, Bld: 86 mg/dL (ref 70–99)

## 2018-12-22 NOTE — Assessment & Plan Note (Signed)
Healthy Discussed fitness Flu vaccine today

## 2018-12-22 NOTE — Progress Notes (Signed)
Subjective:    Patient ID: Ronald Jones, male    DOB: Apr 16, 1980, 38 y.o.   MRN: [id number]  HPI Here for physical  He is doing well No changes in his job Did have vacation at beach last week  No current outpatient medications on file prior to visit.   No current facility-administered medications on file prior to visit.     No Known Allergies  Past Medical History:  Diagnosis Date  . GERD (gastroesophageal reflux disease)     Past Surgical History:  Procedure Laterality Date  . COSMETIC SURGERY     surgery on face as a child, dog bite  . NEVUS EXCISION     2 removed--benign  . WISDOM TOOTH EXTRACTION  09/08    Family History  Problem Relation Age of Onset  . Heart disease Mother   . Diabetes Father   . Heart disease Father   . Testicular cancer Brother   . Coronary artery disease Maternal Grandfather   . Cancer Paternal Grandfather     Social History   Socioeconomic History  . Marital status: Married    Spouse name: Not on file  . Number of children: 1  . Years of education: Not on file  . Highest education level: Not on file  Occupational History  . Occupation: Manufacturing systems engineer  . Financial resource strain: Not on file  . Food insecurity    Worry: Not on file    Inability: Not on file  . Transportation needs    Medical: Not on file    Non-medical: Not on file  Tobacco Use  . Smoking status: Former Smoker    Types: Cigarettes  . Smokeless tobacco: Never Used  Substance and Sexual Activity  . Alcohol use: Yes  . Drug use: No  . Sexual activity: Not on file  Lifestyle  . Physical activity    Days per week: Not on file    Minutes per session: Not on file  . Stress: Not on file  Relationships  . Social Herbalist on phone: Not on file    Gets together: Not on file    Attends religious service: Not on file    Active member of club or organization: Not on file    Attends meetings of clubs or organizations: Not on file   Relationship status: Not on file  . Intimate partner violence    Fear of current or ex partner: Not on file    Emotionally abused: Not on file    Physically abused: Not on file    Forced sexual activity: Not on file  Other Topics Concern  . Not on file  Social History Narrative   Son born 2016   Review of Systems  Constitutional: Negative for fatigue and unexpected weight change.       Not really exercising--discussed Wears seat belt  HENT: Negative for dental problem, hearing loss, tinnitus and trouble swallowing.        Generally keeps up with dentist  Eyes: Negative for visual disturbance.       No diplopia or unilateral vision loss  Respiratory: Negative for cough, chest tightness and shortness of breath.   Cardiovascular: Negative for chest pain, palpitations and leg swelling.  Gastrointestinal: Negative for abdominal pain and blood in stool.       Some soft stools Heartburn occasionally ---zantac in the past   Endocrine: Negative for polydipsia and polyuria.  Musculoskeletal: Negative for arthralgias, back pain  and joint swelling.  Skin: Negative for rash.       No suspicious skin lesions  Allergic/Immunologic: Positive for environmental allergies. Negative for immunocompromised state.       Mild symptoms--uses cetirizine prn  Neurological: Negative for dizziness, syncope, light-headedness and headaches.  Hematological: Negative for adenopathy. Does not bruise/bleed easily.  Psychiatric/Behavioral: Negative for dysphoric mood and sleep disturbance. The patient is not nervous/anxious.        Objective:   Physical Exam  Constitutional: He is oriented to person, place, and time. He appears well-developed. No distress.  HENT:  Head: Normocephalic and atraumatic.  Right Ear: External ear normal.  Left Ear: External ear normal.  Mouth/Throat: Oropharynx is clear and moist. No oropharyngeal exudate.  Eyes: Pupils are equal, round, and reactive to light. Conjunctivae are  normal.  Neck: No thyromegaly present.  Cardiovascular: Normal rate, regular rhythm, normal heart sounds and intact distal pulses. Exam reveals no gallop.  No murmur heard. Respiratory: Effort normal and breath sounds normal. No respiratory distress. He has no wheezes. He has no rales.  GI: Soft. There is no abdominal tenderness.  Genitourinary:    Genitourinary Comments:  No testicular masses   Musculoskeletal:        General: No tenderness or edema.  Lymphadenopathy:    He has no cervical adenopathy.  Neurological: He is alert and oriented to person, place, and time.  Skin: No rash noted. No erythema.  Psychiatric: He has a normal mood and affect. His behavior is normal.           Assessment & Plan:

## 2018-12-22 NOTE — Addendum Note (Signed)
Addended by: Virl Cagey on: 12/22/2018 11:22 AM   Modules accepted: Orders

## 2019-06-20 ENCOUNTER — Ambulatory Visit: Payer: Commercial Managed Care - PPO | Attending: Internal Medicine

## 2019-06-20 DIAGNOSIS — Z23 Encounter for immunization: Secondary | ICD-10-CM

## 2019-06-20 NOTE — Progress Notes (Signed)
.     Covid-19 Vaccination Clinic  Name:  Ronald Jones    MRN: [id number] DOB: 12-Jan-1981  06/20/2019  [name] was observed post Covid-19 immunization for 15 minutes without incident. He was provided with Vaccine Information Sheet and instruction to access the V-Safe system.   [name] was instructed to call 911 with any severe reactions post vaccine: Marland Kitchen Difficulty breathing  . Swelling of face and throat  . A fast heartbeat  . A bad rash all over body  . Dizziness and weakness   Immunizations Administered    Name Date Dose VIS Date Route   Pfizer COVID-19 Vaccine 06/20/2019 12:32 PM 0.3 mL 03/26/2019 Intramuscular   Manufacturer: [place]   Lot: [id number]   [place]: [id number]

## 2019-07-21 ENCOUNTER — Ambulatory Visit: Payer: Self-pay | Attending: Internal Medicine

## 2019-07-21 DIAGNOSIS — Z23 Encounter for immunization: Secondary | ICD-10-CM

## 2019-07-21 NOTE — Progress Notes (Signed)
   Covid-19 Vaccination Clinic  Name:  Ronald Jones    MRN: [id number] DOB: 1980/09/17  07/21/2019  Mr. [name] was observed post Covid-19 immunization for 15 minutes without incident. He was provided with Vaccine Information Sheet and instruction to access the V-Safe system.   Mr. [name] was instructed to call 911 with any severe reactions post vaccine: Marland Kitchen Difficulty breathing  . Swelling of face and throat  . A fast heartbeat  . A bad rash all over body  . Dizziness and weakness   Immunizations Administered    Name Date Dose VIS Date Route   Pfizer COVID-19 Vaccine 07/21/2019  8:22 AM 0.3 mL 03/26/2019 Intramuscular   Manufacturer: [place]   Lot: [id number]   [place]: [id number]

## 2019-12-24 ENCOUNTER — Encounter: Payer: Commercial Managed Care - PPO | Admitting: Internal Medicine

## 2021-01-29 ENCOUNTER — Other Ambulatory Visit: Payer: Self-pay

## 2021-01-29 ENCOUNTER — Ambulatory Visit (INDEPENDENT_AMBULATORY_CARE_PROVIDER_SITE_OTHER): Payer: 59 | Admitting: Internal Medicine

## 2021-01-29 ENCOUNTER — Encounter: Payer: Self-pay | Admitting: Internal Medicine

## 2021-01-29 ENCOUNTER — Telehealth: Payer: Self-pay

## 2021-01-29 VITALS — BP 138/82 | HR 83 | Temp 98.0°F | Ht 73.0 in | Wt 214.0 lb

## 2021-01-29 DIAGNOSIS — Z23 Encounter for immunization: Secondary | ICD-10-CM

## 2021-01-29 DIAGNOSIS — R1032 Left lower quadrant pain: Secondary | ICD-10-CM | POA: Diagnosis not present

## 2021-01-29 NOTE — Assessment & Plan Note (Addendum)
Clearly not like diverticulitis Not urinary related by symptoms No history of IBD---but that is a possibility Doesn't seem to have infection--but will check some labs If ongoing symptoms, will go ahead with GI referral (would likely need colonoscopy) This is not like past IBS symptoms (which were more upper GI related) Discussed considering laxatives--but he seems to be going okay

## 2021-01-29 NOTE — Progress Notes (Signed)
Subjective:    Patient ID: Ronald Jones, male    DOB: March 26, 1981, 40 y.o.   MRN: [id number]  HPI Here due to abdominal pain This visit occurred during the SARS-CoV-2 public health emergency.  Safety protocols were in place, including screening questions prior to the visit, additional usage of staff PPE, and extensive cleaning of exam room while observing appropriate contact time as indicated for disinfecting solutions.   Started 10-12 days ago--noticed pressure towards LLQ Thought he might have pulled a muscle Got up early 2 days ago (got up at [facility] to get ready for deep sea fishing trip) Martin Majestic back to his room---tried to moved bowels  (and went a little)and pain got worse, broke sweat and felt bad Sat for 15-20 minutes and it eased off No blood in stool Did go out on the boat---but didn't eat (back to original milder pressure feeling) Went home eventually, ate---and had blow out stool and it felt better  Pain is better if he is walking around No new tasks and doesn't remember injury Eating may get some worse after eating (goes from 1/10 to 2/10)  No current outpatient medications on file prior to visit.   No current facility-administered medications on file prior to visit.    No Known Allergies  Past Medical History:  Diagnosis Date   GERD (gastroesophageal reflux disease)     Past Surgical History:  Procedure Laterality Date   COSMETIC SURGERY     surgery on face as a child, dog bite   NEVUS EXCISION     2 removed--benign   WISDOM TOOTH EXTRACTION  09/08    Family History  Problem Relation Age of Onset   Heart disease Mother    Diabetes Father    Heart disease Father    Testicular cancer Brother    Coronary artery disease Maternal Grandfather    Cancer Paternal Grandfather     Social History   Socioeconomic History   Marital status: Married    Spouse name: Not on file   Number of children: 1   Years of education: Not on file   Highest education level: Not on  file  Occupational History   Occupation: Dealer  Tobacco Use   Smoking status: Former    Types: Cigarettes   Smokeless tobacco: Never  Substance and Sexual Activity   Alcohol use: Yes   Drug use: No   Sexual activity: Not on file  Other Topics Concern   Not on file  Social History Narrative   Son born 2016   Social Determinants of Health   Financial Resource Strain: Not on file  Food Insecurity: Not on file  Transportation Needs: Not on file  Physical Activity: Not on file  Stress: Not on file  Social Connections: Not on file  Intimate Partner Violence: Not on file   Review of Systems No N/V Appetite is good Bowels have been moving regularly Did have indigestion at first---tums would help (not used in 1 week or so). No regular heartburn No urinary symptoms Has not lost weight    Objective:   Physical Exam Constitutional:      Appearance: He is well-developed.  Pulmonary:     Effort: Pulmonary effort is normal. No respiratory distress.     Breath sounds: Normal breath sounds. No wheezing or rales.  Abdominal:     General: Bowel sounds are normal.     Palpations: Abdomen is soft. There is no mass.     Tenderness: There is no  abdominal tenderness. There is no guarding or rebound.  Neurological:     Mental Status: He is alert.           Assessment & Plan:

## 2021-01-29 NOTE — Addendum Note (Signed)
Addended by: Pilar Grammes on: 01/29/2021 05:06 PM   Modules accepted: Orders

## 2021-01-29 NOTE — Telephone Encounter (Signed)
[facility] - Client TELEPHONE ADVICE RECORD [facility] Patient Name: Ronald Jones Gender: Male DOB: [birth year] Age: 40 Y 69 M 11 D Return Phone Number: [phone] (Primary), [phone] (Secondary) Address: [place] [address] Client [facility] - Client Client Site [facility] - Day Physician Viviana Simpler- MD Contact Type Call Who Is Calling Patient / Member / Family / Caregiver Call Type Triage / Clinical Relationship To Patient Self Return Phone Number [phone] (Primary) Chief Complaint Abdominal Pain Reason for Call Symptomatic / Request for [place] states he is having abdominal pain and pressure. In the left center of the stomach. Caller was transferred by the office to be triage but he has an appt tomorrow. It has been going on for 10 days or so. Yesterday he was in excruciating pain and his left arm went numb. Translation No Nurse Assessment Nurse: Lucky Cowboy, RN, Levada Dy Date/Time (Eastern Time): 01/29/2021 9:26:09 AM Confirm and document reason for call. If symptomatic, describe symptoms. ---Caller stated that he has been having abd pain constant for 10 days, and it was excruciating on Sat. Yesterday his left arm went numb for about 30 min. It is left flank to umbilicus area of this abd. Does the patient have any new or worsening symptoms? ---Yes Will a triage be completed? ---Yes Related visit to physician within the last 2 weeks? ---No Does the PT have any chronic conditions? (i.e. diabetes, asthma, this includes High risk factors for pregnancy, etc.) ---No Is this a behavioral health or substance abuse call? ---No Guidelines Guideline Title Affirmed Question Affirmed Notes Nurse Date/Time Eilene Ghazi Time) Abdominal Pain - Male [1] MILDMODERATE pain AND [2] constant AND [3] present > 2 hours Dew, RN, Levada Dy 01/29/2021  9:27:51 AM Disp. Time Eilene Ghazi Time) Disposition Final User PLEASE NOTE: All timestamps contained within this report are represented as [place] Standard Time. CONFIDENTIALTY NOTICE: This fax transmission is intended only for the addressee. It contains information that is legally privileged, confidential or otherwise protected from use or disclosure. If you are not the intended recipient, you are strictly prohibited from reviewing, disclosing, copying using or disseminating any of this information or taking any action in reliance on or regarding this information. If you have received this fax in error, please notify us immediately by telephone so that we can arrange for its return to [place]. Phone: [phone], Toll-Free: [phone], Fax: [phone] Page: 2 of 2 Call Id: [id number] 01/29/2021 9:30:17 AM See HCP within 4 Hours (or PCP triage) Yes Dew, RN, Marin Shutter Disagree/Comply Comply Caller Understands Yes PreDisposition Call Doctor Care Advice Given Per Guideline SEE HCP (OR PCP TRIAGE) WITHIN 4 HOURS: * IF OFFICE WILL BE OPEN: You need to be seen within the next 3 or 4 hours. Call your doctor (or NP/PA) now or as soon as the office opens. NOTHING BY MOUTH: * Do not eat or drink anything for now. CALL BACK IF: * You become worse CARE ADVICE given per Abdominal Pain, Male (Adult) guideline. Comments User: Raford Pitcher, RN Date/Time Eilene Ghazi Time): 01/29/2021 9:34:47 AM Called backline. No answer. Ins caller to call back to the office with directions to be seen in next 3-4 hours or go to an [place]. Referrals REFERRED TO PCP OFFICE

## 2021-01-29 NOTE — Telephone Encounter (Signed)
Pt already has appt scheduled with Dr Silvio Pate 01/29/21 at 2:30 and pt has appt scheduled with Dr Silvio Pate on 01/30/21 at 9:30. Sending note to Dr Silvio Pate and Larene Beach CMA.

## 2021-01-30 ENCOUNTER — Ambulatory Visit: Payer: Self-pay | Admitting: Internal Medicine

## 2021-01-30 LAB — CBC
HCT: 41.7 % (ref 39.0–52.0)
Hemoglobin: 14.3 g/dL (ref 13.0–17.0)
MCHC: 34.4 g/dL (ref 30.0–36.0)
MCV: 86.6 fl (ref 78.0–100.0)
Platelets: 128 10*3/uL — ABNORMAL LOW (ref 150.0–400.0)
RBC: 4.82 Mil/uL (ref 4.22–5.81)
RDW: 13.9 % (ref 11.5–15.5)
WBC: 4.8 10*3/uL (ref 4.0–10.5)

## 2021-01-30 LAB — COMPREHENSIVE METABOLIC PANEL
ALT: 17 U/L (ref 0–53)
AST: 15 U/L (ref 0–37)
Albumin: 5.1 g/dL (ref 3.5–5.2)
Alkaline Phosphatase: 58 U/L (ref 39–117)
BUN: 20 mg/dL (ref 6–23)
CO2: 25 mEq/L (ref 19–32)
Calcium: 9.5 mg/dL (ref 8.4–10.5)
Chloride: 106 mEq/L (ref 96–112)
Creatinine, Ser: 1.41 mg/dL (ref 0.40–1.50)
GFR: 62.25 mL/min (ref >60.00)
Glucose, Bld: 82 mg/dL (ref 70–99)
Potassium: 4 mEq/L (ref 3.5–5.1)
Sodium: 140 mEq/L (ref 135–145)
Total Bilirubin: 1.7 mg/dL — ABNORMAL HIGH (ref 0.2–1.2)
Total Protein: 7 g/dL (ref 6.0–8.3)

## 2021-01-30 LAB — SEDIMENTATION RATE: Sed Rate: 1 mm/hr (ref 0–15)

## 2021-01-30 NOTE — Telephone Encounter (Signed)
Pt had visit with Dr Silvio Pate on 01/29/21.

## 2021-02-01 ENCOUNTER — Encounter: Payer: Self-pay | Admitting: Gastroenterology

## 2021-02-26 ENCOUNTER — Ambulatory Visit: Payer: 59 | Admitting: Gastroenterology

## 2021-02-26 ENCOUNTER — Encounter: Payer: Self-pay | Admitting: Gastroenterology

## 2021-02-26 VITALS — BP 119/70 | HR 94 | Ht 73.0 in | Wt 220.0 lb

## 2021-02-26 DIAGNOSIS — R1032 Left lower quadrant pain: Secondary | ICD-10-CM | POA: Diagnosis not present

## 2021-02-26 NOTE — Patient Instructions (Signed)
If you are age 40 or older, your body mass index should be between 23-30. Your Body mass index is 29.03 kg/m. If this is out of the aforementioned range listed, please consider follow up with your Primary Care Provider.  If you are age 40 or younger, your body mass index should be between 19-25. Your Body mass index is 29.03 kg/m. If this is out of the aformentioned range listed, please consider follow up with your Primary Care Provider.   You have been scheduled for a CT scan of the abdomen and pelvis at [facility], 1st floor Radiology. You are scheduled on 03/07/21  at [place] should arrive 15 minutes prior to your appointment time for registration.  Please pick up 2 bottles of contrast from [facility] at least 3 days prior to your scan. The solution may taste better if refrigerated, but do NOT add ice or any other liquid to this solution. Shake well before drinking.   Please follow the written instructions below on the day of your exam:   1) Do not eat anything after 6am (4 hours prior to your test)   2) Drink 1 bottle of contrast @ 8am (2 hours prior to your exam)  Remember to shake well before drinking and do NOT pour over ice.     Drink 1 bottle of contrast @ 9am (1 hour prior to your exam)   You may take any medications as prescribed with a small amount of water, if necessary. If you take any of the following medications: METFORMIN, GLUCOPHAGE, GLUCOVANCE, AVANDAMET, RIOMET, FORTAMET, [place] MET, JANUMET, GLUMETZA or METAGLIP, you MAY be asked to HOLD this medication 48 hours AFTER the exam.   The purpose of you drinking the oral contrast is to aid in the visualization of your intestinal tract. The contrast solution may cause some diarrhea. Depending on your individual set of symptoms, you may also receive an intravenous injection of x-ray contrast/dye. Plan on being at [facility] for 45 minutes or longer, depending on the type of exam you are having performed.   It was a  pleasure to see you today!  Thank you for trusting me with your gastrointestinal care!    Scott E.Candis Schatz, MD  If you have any questions regarding your exam or if you need to reschedule, you may call [facility] at [phone] between the hours of 8:00 am and 5:00 pm, Monday-Friday.     ________________________________________________________  The [facility] providers would like to encourage you to use [facility] to communicate with providers for non-urgent requests or questions.  Due to long hold times on the telephone, sending your provider a message by [facility] may be a faster and more efficient way to get a response.  Please allow 48 business hours for a response.  Please remember that this is for non-urgent requests.  _______________________________________________________

## 2021-02-26 NOTE — Progress Notes (Signed)
HPI : Ronald Jones is a very pleasant 40 year old male with no chronic medical problems who is referred to [place] by Dr. Viviana Simpler for further evaluation of new onset abdominal pain.  The patient was in his usual state of health until about a month ago, when he went on a offshore fishing trip and experienced sudden onset severe abdominal pain in his left lower quadrant.  This pain lasted about 30 or 40 minutes, and was associated with diaphoresis and nausea the pain was not associated with any change in bowel habits.  No blood in the stool.  After the pain subsided, the patient continued to have left lower quadrant abdominal pressure sensation, "like something is in there".  This has been persistent.  His bowel movements have not changed, and are characterized by erratic bowel habits, often going 3 to 4 days without a bowel movement.  It was thought that his abdominal pain may have been related to constipation, so he took MiraLAX for a week, and had more frequent bowel movements, every day, but without any improvement in his abdominal pain.  He has been taking probiotics without any change in his symptoms.  The pain seems to be noticed primarily when he is sitting.  It improves when he sits up.  It does not bother him at night in bed.   Past Medical History:  Diagnosis Date   GERD (gastroesophageal reflux disease)    IBS (irritable bowel syndrome)      Past Surgical History:  Procedure Laterality Date   COSMETIC SURGERY     surgery on face as a child, dog bite   NEVUS EXCISION     2 removed--benign   WISDOM TOOTH EXTRACTION  09/08   Family History  Problem Relation Age of Onset   Heart disease Mother    Prostate cancer Father    Diabetes Father    Heart disease Father    Testicular cancer Brother    Coronary artery disease Maternal Grandfather    Cancer Paternal Grandfather    Social History   Tobacco Use   Smoking status: Former    Types: Cigarettes   Smokeless tobacco: Never   Vaping Use   Vaping Use: Never used  Substance Use Topics   Alcohol use: Yes   Drug use: No   No current outpatient medications on file.   No current facility-administered medications for this visit.   No Known Allergies   Review of Systems: All systems reviewed and negative except where noted in HPI.    No results found.  Physical Exam: BP 119/70   Pulse 94   Ht 6\' 1"  (1.854 m)   Wt 220 lb (99.8 kg)   SpO2 97%   BMI 29.03 kg/m  Constitutional: Pleasant,well-developed, Caucasian male in no acute distress. HEENT: Normocephalic and atraumatic. Conjunctivae are normal. No scleral icterus. Cardiovascular: Normal rate, regular rhythm.  Pulmonary/chest: Effort normal and breath sounds normal. No wheezing, rales or rhonchi. Abdominal: Soft, nondistended, nontender.  Pain not reproducible on palpation.  Carnett's sign is negative.  Bowel sounds active throughout. There are no masses palpable. No hepatomegaly. Extremities: no edema Neurological: Alert and oriented to person place and time. Skin: Skin is warm and dry. No rashes noted. Psychiatric: Normal mood and affect. Behavior is normal.  CBC    Component Value Date/Time   WBC 4.8 01/29/2021 1512   RBC 4.82 01/29/2021 1512   HGB 14.3 01/29/2021 1512   HCT 41.7 01/29/2021 1512   PLT 128.0 (L)  01/29/2021 1512   MCV 86.6 01/29/2021 1512   MCHC 34.4 01/29/2021 1512   RDW 13.9 01/29/2021 1512   LYMPHSABS 1.6 12/19/2016 1530   MONOABS 0.5 12/19/2016 1530   EOSABS 0.1 12/19/2016 1530   BASOSABS 0.0 12/19/2016 1530    CMP     Component Value Date/Time   NA 140 01/29/2021 1512   K 4.0 01/29/2021 1512   CL 106 01/29/2021 1512   CO2 25 01/29/2021 1512   GLUCOSE 82 01/29/2021 1512   BUN 20 01/29/2021 1512   CREATININE 1.41 01/29/2021 1512   CALCIUM 9.5 01/29/2021 1512   PROT 7.0 01/29/2021 1512   ALBUMIN 5.1 01/29/2021 1512   AST 15 01/29/2021 1512   ALT 17 01/29/2021 1512   ALKPHOS 58 01/29/2021 1512   BILITOT  1.7 (H) 01/29/2021 1512     ASSESSMENT AND PLAN: 40 year old male with isolated episode of severe abdominal pain 1 month ago, now with persistent LLQ discomfort.  No major changes in bowel habits.  No blood in stool or other red flag symptoms.  No history of chronic GI symptoms.  Unclear etiology for his episode of pain or his continued discomfort.  History not consistent with an acute infectious colitis given the absence of any diarrhea or vomiting.  Very low suspicion for inflammatory bowel disease given abrupt onset and no change in bowel habits.  Given the persistent nature of his symptoms, without any prior history of chronic GI symptoms, I think it is reasonable to proceed with a CT of the abdomen and pelvis to rule out any sort of mass lesion or inflammatory process.  If this is normal, and the pain does not resolve, a colonoscopy may be reasonable.  LLQ pain - CT abd/pelv   CC:  Venia Carbon, MD

## 2021-03-07 ENCOUNTER — Other Ambulatory Visit: Payer: Self-pay

## 2021-03-07 ENCOUNTER — Ambulatory Visit (HOSPITAL_COMMUNITY)
Admission: RE | Admit: 2021-03-07 | Discharge: 2021-03-07 | Disposition: A | Discharge status: Home / Self Care | Payer: 59 | Source: Ambulatory Visit | Attending: Gastroenterology | Admitting: Gastroenterology

## 2021-03-07 DIAGNOSIS — R1032 Left lower quadrant pain: Secondary | ICD-10-CM | POA: Diagnosis not present

## 2021-03-07 MED ORDER — IOHEXOL 350 MG/ML SOLN
80.0000 mL | Freq: Once | INTRAVENOUS | Status: AC | PRN
  Administered 2021-03-07: 80 mL via INTRAVENOUS

## 2021-03-07 MED ORDER — SODIUM CHLORIDE (PF) 0.9 % IJ SOLN
INTRAMUSCULAR | Status: AC
  Filled 2021-03-07: qty 50

## 2021-03-12 NOTE — Progress Notes (Signed)
Ronald Jones,  Your CT did not reveal any obvious causes of your lower abdominal pain.  You did have diverticulosis in your left colon, and sometimes these pockets of the colon can be inflamed (this is called diverticulitis).  This typically presents with abrupt onset abdominal pain, but it typically lasts days, not a few hours.  I am not certain that you had diverticulitis, but it is possible.  There is nothing that needs to be done for it, but there is some evidence that a high fiber diet or fiber supplementation may reduce risk of complications from diverticulosis. The CT did indicate that your spleen was slightly enlarged.  Although this may be a normal variant, you also have had borderline low platelet counts.  Because of this, I would like you to be evaluated by a hematologist to ensure no further work up or follow up is needed.  [name],  Can you please place a referral to heme-onc for further evaluation of splenomegaly and thrombocytopenia?

## 2021-03-13 ENCOUNTER — Other Ambulatory Visit: Payer: Self-pay

## 2021-03-13 DIAGNOSIS — R935 Abnormal findings on diagnostic imaging of other abdominal regions, including retroperitoneum: Secondary | ICD-10-CM

## 2021-03-26 ENCOUNTER — Telehealth: Payer: Self-pay | Admitting: *Deleted

## 2021-03-26 ENCOUNTER — Encounter: Payer: Self-pay | Admitting: *Deleted

## 2021-03-26 DIAGNOSIS — R161 Splenomegaly, not elsewhere classified: Secondary | ICD-10-CM | POA: Insufficient documentation

## 2021-03-26 NOTE — Telephone Encounter (Signed)
I attempted to reach patient prior to his NP apt with Dr. Grayland Ormond to introduce the medical services in the cancer center. No answer. Mychart msg sent to patient.

## 2021-03-26 NOTE — Progress Notes (Signed)
[facility]  [phone] Fax:(336) [phone]  ID: Silvano Bilis OB: Dec 18, 1980  MR#: [id number]  XTG#:[id number]  Patient Care Team: Venia Carbon, MD as PCP - General  CHIEF COMPLAINT: Splenomegaly.  INTERVAL HISTORY: Patient is a 40 year old male who initially presented with left-sided abdominal pain.  Subsequent CT scan revealed a mild splenomegaly.  He continues to have intermittent pain, but this does not affect his day-to-day activity and he continues to be active and work full-time.  He otherwise feels well.  He has no neurologic complaints.  He denies any recent fevers or illnesses.  He has a good appetite and denies weight loss.  He has no chest pain, shortness of breath, cough or hemoptysis.  He denies any nausea, vomiting, constipation, or diarrhea.  He has no melena or hematochezia.  He has no urinary complaints.  Patient otherwise feels well and offers no further specific complaints today.  REVIEW OF SYSTEMS:   Review of Systems  Constitutional: Negative.  Negative for fever, malaise/fatigue and weight loss.  Respiratory: Negative.  Negative for cough, hemoptysis and shortness of breath.   Cardiovascular: Negative.  Negative for chest pain and leg swelling.  Gastrointestinal:  Positive for abdominal pain. Negative for blood in stool and melena.  Genitourinary: Negative.  Negative for dysuria.  Musculoskeletal: Negative.  Negative for back pain.  Skin: Negative.  Negative for rash.  Neurological: Negative.  Negative for dizziness, focal weakness, weakness and headaches.  Psychiatric/Behavioral: Negative.  The patient is not nervous/anxious.    As per HPI. Otherwise, a complete review of systems is negative.  PAST MEDICAL HISTORY: Past Medical History:  Diagnosis Date   Diverticulosis    GERD (gastroesophageal reflux disease)    IBS (irritable bowel syndrome)    Splenomegaly    Thrombocytopenia (HCC)     PAST SURGICAL HISTORY: Past  Surgical History:  Procedure Laterality Date   COSMETIC SURGERY     surgery on face as a child, dog bite   NEVUS EXCISION     2 removed--benign   WISDOM TOOTH EXTRACTION  09/08    FAMILY HISTORY: Family History  Problem Relation Age of Onset   Heart disease Mother    Prostate cancer Father    Diabetes Father    Heart disease Father    Testicular cancer Brother    Coronary artery disease Maternal Grandfather    Cancer Paternal Grandfather     ADVANCED DIRECTIVES (Y/N):  N  HEALTH MAINTENANCE: Social History   Tobacco Use   Smoking status: Former    Types: Cigarettes   Smokeless tobacco: Never  Vaping Use   Vaping Use: Never used  Substance Use Topics   Alcohol use: Yes   Drug use: No     Colonoscopy:  PAP:  Bone density:  Lipid panel:  No Known Allergies  Current Outpatient Medications  Medication Sig Dispense Refill   guaiFENesin (MUCINEX) 600 MG 12 hr tablet Take by mouth 2 (two) times daily.     No current facility-administered medications for this visit.    OBJECTIVE: Vitals:   03/27/21 1328  BP: 128/90  Pulse: 89  Resp: 18  Temp: 97.8 F (36.6 C)  SpO2: 100%     Body mass index is 28.76 kg/m.    ECOG FS:0 - Asymptomatic  General: Well-developed, well-nourished, no acute distress. Eyes: Pink conjunctiva, anicteric sclera. HEENT: Normocephalic, moist mucous membranes. Lungs: No audible wheezing or coughing. Heart: Regular rate and rhythm. Abdomen: Soft, nontender, no obvious distention.  Musculoskeletal: No edema, cyanosis, or clubbing. Neuro: Alert, answering all questions appropriately. Cranial nerves grossly intact. Skin: No rashes or petechiae noted. Psych: Normal affect. Lymphatics: No cervical, calvicular, axillary or inguinal LAD.   LAB RESULTS:  Lab Results  Component Value Date   NA 138 03/27/2021   K 4.1 03/27/2021   CL 106 03/27/2021   CO2 24 03/27/2021   GLUCOSE 100 (H) 03/27/2021   BUN 15 03/27/2021   CREATININE 1.36  (H) 03/27/2021   CALCIUM 9.1 03/27/2021   PROT 7.4 03/27/2021   ALBUMIN 4.6 03/27/2021   AST 22 03/27/2021   ALT 29 03/27/2021   ALKPHOS 69 03/27/2021   BILITOT 0.9 03/27/2021   GFRNONAA >60 03/27/2021    Lab Results  Component Value Date   WBC 6.6 03/27/2021   NEUTROABS 4.8 03/27/2021   HGB 14.1 03/27/2021   HCT 41.2 03/27/2021   MCV 87.8 03/27/2021   PLT 134 (L) 03/27/2021     STUDIES: CT Abdomen Pelvis W Contrast  Result Date: 03/07/2021 CLINICAL DATA:  Left lower quadrant abdominal pain for 2 months. EXAM: CT ABDOMEN AND PELVIS WITH CONTRAST TECHNIQUE: Multidetector CT imaging of the abdomen and pelvis was performed using the standard protocol following bolus administration of intravenous contrast. CONTRAST:  41mL OMNIPAQUE IOHEXOL 350 MG/ML SOLN COMPARISON:  None. FINDINGS: Lower chest: No acute abnormality. Hepatobiliary: No focal liver abnormality is seen. No gallstones, gallbladder wall thickening, or biliary dilatation. Pancreas: Unremarkable. No pancreatic ductal dilatation or surrounding inflammatory changes. Spleen: Mild splenomegaly with splenic volume estimated to be approximately 880 mL. Adrenals/Urinary Tract: Adrenal glands are unremarkable. Kidneys are normal, without renal calculi, focal lesion, or hydronephrosis. Simple cyst of upper pole of left kidney. Bladder is unremarkable. Stomach/Bowel: Bowel shows no evidence of obstruction, ileus, inflammation or lesion. The appendix is normal. No free intraperitoneal air identified. Mild diverticulosis of the sigmoid colon. No evidence of acute diverticulitis. Vascular/Lymphatic: No significant vascular findings are present. No enlarged abdominal or pelvic lymph nodes. Reproductive: Prostate is unremarkable. Other: No abdominal wall hernia or abnormality. No abdominopelvic ascites. Musculoskeletal: No acute or significant osseous findings. IMPRESSION: 1. Mild diverticulosis of the sigmoid colon without evidence of acute  diverticulitis. 2. Splenomegaly with estimated splenic volume of 880 mL. Electronically Signed   By: Aletta Edouard M.D.   On: 03/07/2021 16:55    ASSESSMENT: Splenomegaly.  PLAN:    Splenomegaly: CT scan results from March 07, 2021 reviewed independently and reported as above with mild splenomegaly.  Unknown etiology.  This is unlikely the source of his intermittent flank pain.  There is no evidence of splenic infarct.  No intervention is needed at this time.  Plan for a abdominal ultrasound to assess splenic volume in 3 months to assess any interval change. Thrombocytopenia: Mild.  Patient's platelet count is 134 today.  Possibly related to underlying mild splenomegaly.  Monitor.  I spent a total of 45 minutes reviewing chart data, face-to-face evaluation with the patient, counseling and coordination of care as detailed above.   Patient expressed understanding and was in agreement with this plan. He also understands that He can call clinic at any time with any questions, concerns, or complaints.    Cancer Staging  No matching staging information was found for the patient.  Lloyd Huger, MD   03/28/2021 6:50 AM

## 2021-03-27 ENCOUNTER — Other Ambulatory Visit: Payer: Self-pay

## 2021-03-27 ENCOUNTER — Inpatient Hospital Stay: Payer: 59

## 2021-03-27 ENCOUNTER — Encounter: Payer: Self-pay | Admitting: Oncology

## 2021-03-27 ENCOUNTER — Inpatient Hospital Stay: Payer: 59 | Attending: Oncology | Admitting: Oncology

## 2021-03-27 DIAGNOSIS — R161 Splenomegaly, not elsewhere classified: Secondary | ICD-10-CM

## 2021-03-27 DIAGNOSIS — Z809 Family history of malignant neoplasm, unspecified: Secondary | ICD-10-CM | POA: Diagnosis not present

## 2021-03-27 DIAGNOSIS — Z8042 Family history of malignant neoplasm of prostate: Secondary | ICD-10-CM | POA: Diagnosis not present

## 2021-03-27 DIAGNOSIS — D696 Thrombocytopenia, unspecified: Secondary | ICD-10-CM | POA: Diagnosis not present

## 2021-03-27 DIAGNOSIS — Z87891 Personal history of nicotine dependence: Secondary | ICD-10-CM | POA: Diagnosis not present

## 2021-03-27 LAB — CBC WITH DIFFERENTIAL/PLATELET
Abs Immature Granulocytes: 0.03 10*3/uL (ref 0.00–0.07)
Basophils Absolute: 0 10*3/uL (ref 0.0–0.1)
Basophils Relative: 1 %
Eosinophils Absolute: 0.1 10*3/uL (ref 0.0–0.5)
Eosinophils Relative: 2 %
HCT: 41.2 % (ref 39.0–52.0)
Hemoglobin: 14.1 g/dL (ref 13.0–17.0)
Immature Granulocytes: 1 %
Lymphocytes Relative: 14 %
Lymphs Abs: 0.9 10*3/uL (ref 0.7–4.0)
MCH: 30.1 pg (ref 26.0–34.0)
MCHC: 34.2 g/dL (ref 30.0–36.0)
MCV: 87.8 fL (ref 80.0–100.0)
Monocytes Absolute: 0.7 10*3/uL (ref 0.1–1.0)
Monocytes Relative: 10 %
Neutro Abs: 4.8 10*3/uL (ref 1.7–7.7)
Neutrophils Relative %: 72 %
Platelets: 134 10*3/uL — ABNORMAL LOW (ref 150–400)
RBC: 4.69 MIL/uL (ref 4.22–5.81)
RDW: 13.4 % (ref 11.5–15.5)
WBC: 6.6 10*3/uL (ref 4.0–10.5)
nRBC: 0 % (ref 0.0–0.2)

## 2021-03-27 LAB — COMPREHENSIVE METABOLIC PANEL
ALT: 29 U/L (ref 0–44)
AST: 22 U/L (ref 15–41)
Albumin: 4.6 g/dL (ref 3.5–5.0)
Alkaline Phosphatase: 69 U/L (ref 38–126)
Anion gap: 8 (ref 5–15)
BUN: 15 mg/dL (ref 6–20)
CO2: 24 mmol/L (ref 22–32)
Calcium: 9.1 mg/dL (ref 8.9–10.3)
Chloride: 106 mmol/L (ref 98–111)
Creatinine, Ser: 1.36 mg/dL — ABNORMAL HIGH (ref 0.61–1.24)
GFR, Estimated: >60 mL/min (ref >60)
Glucose, Bld: 100 mg/dL — ABNORMAL HIGH (ref 70–99)
Potassium: 4.1 mmol/L (ref 3.5–5.1)
Sodium: 138 mmol/L (ref 135–145)
Total Bilirubin: 0.9 mg/dL (ref 0.3–1.2)
Total Protein: 7.4 g/dL (ref 6.5–8.1)

## 2021-03-27 LAB — LACTATE DEHYDROGENASE: LDH: 125 U/L (ref 98–192)

## 2021-03-27 NOTE — Progress Notes (Signed)
Patient reports abdominal pressure on upper left side that gets worse as the day progresses.

## 2021-03-29 LAB — COMP PANEL: LEUKEMIA/LYMPHOMA

## 2021-06-25 ENCOUNTER — Other Ambulatory Visit: Payer: Self-pay

## 2021-06-25 ENCOUNTER — Ambulatory Visit
Admission: RE | Admit: 2021-06-25 | Discharge: 2021-06-25 | Disposition: A | Discharge status: Home / Self Care | Payer: 59 | Source: Ambulatory Visit | Attending: Oncology | Admitting: Oncology

## 2021-06-25 DIAGNOSIS — R161 Splenomegaly, not elsewhere classified: Secondary | ICD-10-CM | POA: Insufficient documentation

## 2021-06-28 ENCOUNTER — Encounter: Payer: Self-pay | Admitting: Nurse Practitioner

## 2021-06-28 ENCOUNTER — Other Ambulatory Visit: Payer: Self-pay

## 2021-06-28 ENCOUNTER — Inpatient Hospital Stay: Payer: 59

## 2021-06-28 ENCOUNTER — Inpatient Hospital Stay: Payer: 59 | Attending: Oncology | Admitting: Nurse Practitioner

## 2021-06-28 VITALS — BP 125/83 | HR 78 | Temp 96.7°F | Wt 215.0 lb

## 2021-06-28 DIAGNOSIS — D696 Thrombocytopenia, unspecified: Secondary | ICD-10-CM | POA: Diagnosis not present

## 2021-06-28 DIAGNOSIS — R161 Splenomegaly, not elsewhere classified: Secondary | ICD-10-CM | POA: Diagnosis not present

## 2021-06-28 DIAGNOSIS — Z87891 Personal history of nicotine dependence: Secondary | ICD-10-CM | POA: Diagnosis not present

## 2021-06-28 DIAGNOSIS — R109 Unspecified abdominal pain: Secondary | ICD-10-CM | POA: Insufficient documentation

## 2021-06-28 LAB — CBC WITH DIFFERENTIAL/PLATELET
Abs Immature Granulocytes: 0.02 10*3/uL (ref 0.00–0.07)
Basophils Absolute: 0 10*3/uL (ref 0.0–0.1)
Basophils Relative: 1 %
Eosinophils Absolute: 0.1 10*3/uL (ref 0.0–0.5)
Eosinophils Relative: 2 %
HCT: 42.8 % (ref 39.0–52.0)
Hemoglobin: 14.8 g/dL (ref 13.0–17.0)
Immature Granulocytes: 1 %
Lymphocytes Relative: 30 %
Lymphs Abs: 1.3 10*3/uL (ref 0.7–4.0)
MCH: 29.7 pg (ref 26.0–34.0)
MCHC: 34.6 g/dL (ref 30.0–36.0)
MCV: 85.9 fL (ref 80.0–100.0)
Monocytes Absolute: 0.5 10*3/uL (ref 0.1–1.0)
Monocytes Relative: 10 %
Neutro Abs: 2.5 10*3/uL (ref 1.7–7.7)
Neutrophils Relative %: 56 %
Platelets: 123 10*3/uL — ABNORMAL LOW (ref 150–400)
RBC: 4.98 MIL/uL (ref 4.22–5.81)
RDW: 13.2 % (ref 11.5–15.5)
WBC: 4.4 10*3/uL (ref 4.0–10.5)
nRBC: 0 % (ref 0.0–0.2)

## 2021-06-28 NOTE — Progress Notes (Signed)
?[facility]  ?[phone] [phone] ? ?ID: Silvano Bilis OB: [birth year]  MR#: [id number]  JSH#:[id number] ? ?Patient Care Team: ?Venia Carbon, MD as PCP - General ? ?CHIEF COMPLAINT: Splenomegaly ? ?INTERVAL HISTORY: Patient is 41 year old male who returns to clinic for follow-up of splenomegaly.  He continues to have mild lower left-sided abdominal pain worse with eating and gradually worsens through the day.  CT for pain revealed mild splenomegaly.  Overall he feels well and denies other complaints.  His father passed away in 16-May-2022 secondary to blood loss related to diverticulitis.  He denies black or bloody stools.  No abnormal bleeding or bruising. ? ?REVIEW OF SYSTEMS:   ?Review of Systems  ?Constitutional: Negative.  Negative for fever, malaise/fatigue and weight loss.  ?Respiratory: Negative.  Negative for cough, hemoptysis and shortness of breath.   ?Cardiovascular: Negative.  Negative for chest pain and leg swelling.  ?Gastrointestinal:  Positive for abdominal pain. Negative for blood in stool and melena.  ?Genitourinary: Negative.  Negative for dysuria.  ?Musculoskeletal: Negative.  Negative for back pain.  ?Skin: Negative.  Negative for rash.  ?Neurological: Negative.  Negative for dizziness, focal weakness, weakness and headaches.  ?Psychiatric/Behavioral: Negative.  The patient is not nervous/anxious.   ? ?As per HPI. Otherwise, a complete review of systems is negative. ? ?PAST MEDICAL HISTORY: ?Past Medical History:  ?Diagnosis Date  ? Diverticulosis   ? GERD (gastroesophageal reflux disease)   ? IBS (irritable bowel syndrome)   ? Splenomegaly   ? Thrombocytopenia ([place])   ? ? ?PAST SURGICAL HISTORY: ?Past Surgical History:  ?Procedure Laterality Date  ? COSMETIC SURGERY    ? surgery on face as a child, dog bite  ? NEVUS EXCISION    ? 2 removed--benign  ? WISDOM TOOTH EXTRACTION  09/08  ? ? ?FAMILY HISTORY: ?Family History  ?Problem Relation Age of Onset  ?  Heart disease Mother   ? Prostate cancer Father   ? Diabetes Father   ? Heart disease Father   ? Testicular cancer Brother   ? Coronary artery disease Maternal Grandfather   ? Cancer Paternal Grandfather   ? ? ?ADVANCED DIRECTIVES (Y/N):  N ? ?HEALTH MAINTENANCE: ?Social History  ? ?Tobacco Use  ? Smoking status: Former  ?  Types: Cigarettes  ? Smokeless tobacco: Never  ?Vaping Use  ? Vaping Use: Never used  ?Substance Use Topics  ? Alcohol use: Yes  ? Drug use: No  ? ? ? Colonoscopy: ? PAP: ? Bone density: ? Lipid panel: ? ?No Known Allergies ? ?Current Outpatient Medications  ?Medication Sig Dispense Refill  ? guaiFENesin (MUCINEX) 600 MG 12 hr tablet Take by mouth 2 (two) times daily.    ? ?No current facility-administered medications for this visit.  ? ? ?OBJECTIVE: ?Vitals:  ? 06/28/21 1054  ?BP: 125/83  ?Pulse: 78  ?Temp: (!) 96.7 ?F (35.9 ?C)  ?   Body mass index is 28.37 kg/m?Marland Kitchen    ECOG FS:0 - Asymptomatic ? ?General: Well-developed, well-nourished, no acute distress. ?Eyes: Pink conjunctiva, anicteric sclera. ?Lungs: Clear to auscultation bilaterally.  No audible wheezing or coughing ?Heart: Regular rate and rhythm.  ?Abdomen: Soft, nontender, nondistended.  ?Musculoskeletal: No edema, cyanosis, or clubbing. ?Neuro: Alert, answering all questions appropriately. Cranial nerves grossly intact. ?Skin: No rashes or petechiae noted. ?Psych: Normal affect. ? ? ?LAB RESULTS: ? ?Lab Results  ?Component Value Date  ? NA 138 03/27/2021  ? K 4.1 03/27/2021  ? CL 106  03/27/2021  ? CO2 24 03/27/2021  ? GLUCOSE 100 (H) 03/27/2021  ? BUN 15 03/27/2021  ? CREATININE 1.36 (H) 03/27/2021  ? CALCIUM 9.1 03/27/2021  ? PROT 7.4 03/27/2021  ? ALBUMIN 4.6 03/27/2021  ? AST 22 03/27/2021  ? ALT 29 03/27/2021  ? ALKPHOS 69 03/27/2021  ? BILITOT 0.9 03/27/2021  ? GFRNONAA >60 03/27/2021  ? ? ?Lab Results  ?Component Value Date  ? WBC 6.6 03/27/2021  ? NEUTROABS 4.8 03/27/2021  ? HGB 14.1 03/27/2021  ? HCT 41.2 03/27/2021  ? MCV 87.8  03/27/2021  ? PLT 134 (L) 03/27/2021  ? ? ? ?STUDIES: ?US Abdomen Complete ? ?Result Date: 06/25/2021 ?CLINICAL DATA:  Splenomegaly EXAM: ABDOMEN ULTRASOUND COMPLETE COMPARISON:  CT March 07, 2021 FINDINGS: Gallbladder: No gallstones or wall thickening visualized. No sonographic Murphy sign noted by sonographer. Common bile duct: Diameter: 4 mm Liver: No focal lesion identified. Slightly increased parenchymal echogenicity. Portal vein is patent on color Doppler imaging with normal direction of blood flow towards the liver. IVC: No abnormality visualized. Pancreas: Visualized portion unremarkable. Spleen: Similar splenomegaly with a volume of 865 cc previously 880. Right Kidney: Length: 12.3 cm. Echogenicity within normal limits. No mass or hydronephrosis visualized. Left Kidney: Length: 11 cm. Echogenicity within normal limits. 2.6 cm left upper pole renal cyst. Hydronephrosis visualized. Abdominal aorta: No aneurysm visualized. Other findings: None. IMPRESSION: Similar splenomegaly. Electronically Signed   By: Dahlia Bailiff M.D.   On: 06/25/2021 09:21   ? ?ASSESSMENT: Splenomegaly. ? ?PLAN:   ? ?Splenomegaly: CT from November 2022 for left lower quadrant abdominal pain reported mild splenomegaly of unknown etiology.  Interim ultrasound to evaluate further performed on 06/26/2019.  Which was independently reviewed and revealed similar, stable splenomegaly. Will get labs today to evaluate for thrombocytopenia.  ?Abdominal pain- unlikely related to incidental and mild splenomegaly.  No evidence of splenic artifact. Recommend he see GI, possibly colonoscopy.  ?Thrombocytopenia-mild.  Possibly related to underlying mild splenomegaly.  We will check CBC today to evaluate for stability.  Clinically, he is asymptomatic.   ? ?Disposition: ?Lab today ?6 mo- abdominal ultrasound ?Day to week later- Lab (cbc), see Dr. Grayland Ormond for followup ? ?Patient expressed understanding and was in agreement with this plan. He also  understands that He can call clinic at any time with any questions, concerns, or complaints.  ? ? Cancer Staging  ?No matching staging information was found for the patient. ? ?Verlon Au, NP   06/28/2021  ? ? ? ? ?

## 2021-11-12 ENCOUNTER — Encounter: Payer: Self-pay | Admitting: Gastroenterology

## 2021-11-19 ENCOUNTER — Other Ambulatory Visit: Payer: Self-pay

## 2021-11-19 ENCOUNTER — Emergency Department
Admission: EM | Admit: 2021-11-19 | Discharge: 2021-11-19 | Disposition: A | Discharge status: Home / Self Care | Payer: 59 | Attending: Emergency Medicine | Admitting: Emergency Medicine

## 2021-11-19 ENCOUNTER — Emergency Department: Payer: 59

## 2021-11-19 ENCOUNTER — Encounter: Payer: Self-pay | Admitting: Emergency Medicine

## 2021-11-19 DIAGNOSIS — R1084 Generalized abdominal pain: Secondary | ICD-10-CM | POA: Diagnosis present

## 2021-11-19 LAB — URINALYSIS, ROUTINE W REFLEX MICROSCOPIC
Bilirubin Urine: NEGATIVE
Glucose, UA: NEGATIVE mg/dL
Hgb urine dipstick: NEGATIVE
Ketones, ur: NEGATIVE mg/dL
Leukocytes,Ua: NEGATIVE
Nitrite: NEGATIVE
Protein, ur: NEGATIVE mg/dL
Specific Gravity, Urine: 1.025 (ref 1.005–1.030)
pH: 5 (ref 5.0–8.0)

## 2021-11-19 LAB — COMPREHENSIVE METABOLIC PANEL
ALT: 29 U/L (ref 0–44)
AST: 22 U/L (ref 15–41)
Albumin: 4.6 g/dL (ref 3.5–5.0)
Alkaline Phosphatase: 69 U/L (ref 38–126)
Anion gap: 10 (ref 5–15)
BUN: 16 mg/dL (ref 6–20)
CO2: 23 mmol/L (ref 22–32)
Calcium: 9.2 mg/dL (ref 8.9–10.3)
Chloride: 108 mmol/L (ref 98–111)
Creatinine, Ser: 1.25 mg/dL — ABNORMAL HIGH (ref 0.61–1.24)
GFR, Estimated: >60 mL/min (ref >60)
Glucose, Bld: 93 mg/dL (ref 70–99)
Potassium: 3.6 mmol/L (ref 3.5–5.1)
Sodium: 141 mmol/L (ref 135–145)
Total Bilirubin: 1.8 mg/dL — ABNORMAL HIGH (ref 0.3–1.2)
Total Protein: 7.5 g/dL (ref 6.5–8.1)

## 2021-11-19 LAB — CBC
HCT: 40.1 % (ref 39.0–52.0)
Hemoglobin: 13.9 g/dL (ref 13.0–17.0)
MCH: 29.9 pg (ref 26.0–34.0)
MCHC: 34.7 g/dL (ref 30.0–36.0)
MCV: 86.2 fL (ref 80.0–100.0)
Platelets: 140 10*3/uL — ABNORMAL LOW (ref 150–400)
RBC: 4.65 MIL/uL (ref 4.22–5.81)
RDW: 12.8 % (ref 11.5–15.5)
WBC: 6 10*3/uL (ref 4.0–10.5)
nRBC: 0 % (ref 0.0–0.2)

## 2021-11-19 LAB — LIPASE, BLOOD: Lipase: 34 U/L (ref 11–51)

## 2021-11-19 MED ORDER — DICYCLOMINE HCL 10 MG PO CAPS
20.0000 mg | ORAL_CAPSULE | Freq: Once | ORAL | Status: AC
  Administered 2021-11-19: 20 mg via ORAL

## 2021-11-19 MED ORDER — DICYCLOMINE HCL 10 MG PO CAPS: 20.0000 mg | ORAL_CAPSULE | Freq: Three times a day (TID) | ORAL | 0 refills | Status: DC

## 2021-11-19 MED ORDER — IOHEXOL 300 MG/ML  SOLN
100.0000 mL | Freq: Once | INTRAMUSCULAR | Status: AC | PRN
  Administered 2021-11-19: 100 mL via INTRAVENOUS

## 2021-11-19 MED ORDER — AZITHROMYCIN 250 MG PO TABS: ORAL_TABLET | ORAL | 0 refills | Status: DC

## 2021-11-19 MED ORDER — DICYCLOMINE HCL 10 MG PO CAPS
20.0000 mg | ORAL_CAPSULE | Freq: Once | ORAL | Status: DC
Start: 2021-11-19 — End: 2021-11-19

## 2021-11-19 NOTE — ED Triage Notes (Signed)
Patient c/o generalized abdominal pain onset of this weekend. Patient states this has been an ongoing issue over the past year. States pain started in RUQ and now has moved all abdomen.

## 2021-11-19 NOTE — ED Provider Triage Note (Signed)
Emergency Medicine Provider Triage Evaluation Note  Ronald Jones , a 41 y.o. male  was evaluated in triage.  Pt complains of acute on chronic diffuse abdominal pain. Symptoms worsened this weekend. No fever. Colonoscopy scheduled for September.   Physical Exam  BP 131/89 (BP Location: Left Arm)   Pulse 94   Resp 18   Ht '6\' 1"'$  (1.854 m)   Wt 94.3 kg   SpO2 100%   BMI 27.44 kg/m  Gen:   Awake, no distress   Resp:  Normal effort  MSK:   Moves extremities without difficulty  Other:    Medical Decision Making  Medically screening exam initiated at 1:31 PM.  Appropriate orders placed.  Ronald Jones was informed that the remainder of the evaluation will be completed by another provider, this initial triage assessment does not replace that evaluation, and the importance of remaining in the ED until their evaluation is complete.    Victorino Dike, FNP 11/19/21 1334

## 2021-11-19 NOTE — Discharge Instructions (Addendum)
Your exam, labs, and CT scan are normal and reassuring at this time.  No sign of any serious intra-abdominal process.  Mildly enlarged spleen, and diverticulosis are again identified without signs of acute splenic infarct or diverticulitis.  Take the prescription Bentyl as directed.  Follow-up with your PCP or GI specialist for ongoing management.  Return to the ED if needed.

## 2021-11-19 NOTE — ED Provider Notes (Signed)
[facility] Emergency Department Provider Note     Event Date/Time   First MD Initiated Contact with Patient 11/19/21 1523     (approximate)   History   Abdominal Pain   HPI  Ronald Jones is a 41 y.o. male with a history of splenomegaly, thyroid cytopenia, IBS, and chronic abdominal pain, presents to the ED for evaluation of acute on chronic abdominal pain.  Patient reports what he describes generalized abdominal pain with onset this weekend.  Symptoms have been persistent for better than a year, with ongoing evaluation by oncology.  Patient reports he initially noted some right upper quadrant discomfort and now the pain is moved to the general abdominal regions.  He denies any associated fevers, chills, sweats.  He also denies any bladder or bowel incontinence, nausea, vomiting, or constipation.   Physical Exam   Triage Vital Signs: ED Triage Vitals  Enc Vitals Group     BP 11/19/21 1330 131/89     Pulse Rate 11/19/21 1330 94     Resp 11/19/21 1330 18     Temp 11/19/21 1333 98.2 F (36.8 C)     Temp Source 11/19/21 1330 Oral     SpO2 11/19/21 1330 100 %     Weight 11/19/21 1331 208 lb (94.3 kg)     Height 11/19/21 1331 _0  (1.854 m)     Head Circumference --      Peak Flow --      Pain Score --      Pain Loc --      Pain Edu? --      Excl. in [place]? --     Most recent vital signs: Vitals:   11/19/21 1553 11/19/21 1734  BP: 131/85 128/86  Pulse: 87 84  Resp: 17 17  Temp:  98.2 F (36.8 C)  SpO2: 95% 96%    General Awake, no distress.  CV:  Good peripheral perfusion.  RESP:  Normal effort.  ABD:  No distention.  Soft and nontender.  Normal bowel sounds appreciated.    ED Results / Procedures / Treatments   Labs (all labs ordered are listed, but only abnormal results are displayed) Labs Reviewed  COMPREHENSIVE METABOLIC PANEL - Abnormal; Notable for the following components:      Result Value   Creatinine, Ser 1.25 (*)     Total Bilirubin 1.8 (*)    All other components within normal limits  CBC - Abnormal; Notable for the following components:   Platelets 140 (*)    All other components within normal limits  URINALYSIS, ROUTINE W REFLEX MICROSCOPIC - Abnormal; Notable for the following components:   Color, Urine YELLOW (*)    APPearance CLEAR (*)    All other components within normal limits  LIPASE, BLOOD     EKG   RADIOLOGY  I personally viewed and evaluated these images as part of my medical decision making, as well as reviewing the written report by the radiologist.  ED Provider Interpretation: no acute findings}  CT ABDOMEN PELVIS W CONTRAST  Result Date: 11/19/2021 CLINICAL DATA:  Generalized abdominal pain EXAM: CT ABDOMEN AND PELVIS WITH CONTRAST TECHNIQUE: Multidetector CT imaging of the abdomen and pelvis was performed using the standard protocol following bolus administration of intravenous contrast. RADIATION DOSE REDUCTION: This exam was performed according to the departmental dose-optimization program which includes automated exposure control, adjustment of the mA and/or kV according to patient size and/or use of iterative reconstruction technique. CONTRAST:  131m OMNIPAQUE IOHEXOL 300 MG/ML  SOLN COMPARISON:  CT abdomen pelvis 03/07/2021 FINDINGS: Lower chest: No acute abnormality. Hepatobiliary: Stable tiny hypodensity in the anterior left liver, too small to fully characterize. No new focal liver lesion. Normal appearance of the gallbladder. No intra or extrahepatic biliary duct dilation. Pancreas: Unremarkable. No pancreatic ductal dilatation or surrounding inflammatory changes. Spleen: The spleen measures 17 cm in craniocaudal dimension. No focal splenic lesion. Adrenals/Urinary Tract: Adrenal glands are unremarkable. There is a simple cyst in the superior pole of the left kidney measuring 2.9 cm. No suspicious solid mass. No renal calculi or hydronephrosis. Unremarkable urinary bladder.  Stomach/Bowel: Multiple colonic stomach is within normal limits. Appendix appears normal. No evidence of bowel wall thickening, distention, or inflammatory changes. Diverticula without evidence of diverticulitis. Vascular/Lymphatic: No significant vascular findings are present. No enlarged abdominal or pelvic lymph nodes. Reproductive: Borderline enlarged prostate measuring 5.0 cm. Other: No abdominal wall hernia or abnormality. No abdominopelvic ascites. Musculoskeletal: No acute or significant osseous findings. IMPRESSION: 1. No acute intra-abdominal pathology. 2. Splenomegaly. 3. Colonic diverticula without evidence of diverticulitis. 4. Borderline enlarged prostate. Electronically Signed   By: NAudie PintoM.D.   On: 11/19/2021 17:09     PROCEDURES:  Critical Care performed: No  Procedures   MEDICATIONS ORDERED IN ED: Medications  iohexol (OMNIPAQUE) 300 MG/ML solution 100 mL (100 mLs Intravenous Contrast Given 11/19/21 1652)  dicyclomine (BENTYL) capsule 20 mg (20 mg Oral Given 11/19/21 1728)     IMPRESSION / MDM / ASSESSMENT AND PLAN / ED COURSE  I reviewed the triage vital signs and the nursing notes.                              Differential diagnosis includes, but is not limited to, acute appendicitis, renal colic, testicular torsion, urinary tract infection/pyelonephritis, prostatitis,  epididymitis, diverticulitis, small bowel obstruction or ileus, colitis, abdominal aortic aneurysm, gastroenteritis, hernia, etc.   Patient's presentation is most consistent with acute complicated illness / injury requiring diagnostic workup.  Patient's diagnosis is consistent with generalized abdominal pain without underlying cause of etiology.  Patient with evidence of diverticulosis without diverticulitis, and stable splenomegaly. Patient will be discharged home with prescriptions for Bentyl and azithromycin.  Patient is also given a stool collection kit that he may present to his PCP for  further testing of continued diarrheal symptoms.  Patient is to follow up with his GI specialist as needed or otherwise directed. Patient is given ED precautions to return to the ED for any worsening or new symptoms.     FINAL CLINICAL IMPRESSION(S) / ED DIAGNOSES   Final diagnoses:  Generalized abdominal pain     Rx / DC Orders   ED Discharge Orders          Ordered    dicyclomine (BENTYL) 10 MG capsule  3 times daily before meals        11/19/21 1716    azithromycin (ZITHROMAX Z-PAK) 250 MG tablet        11/19/21 1752             Note:  This document was prepared using Dragon voice recognition software and may include unintentional dictation errors.    MMelvenia Needles PA-C 11/19/21 1752    QDelman Kitten MD 11/20/21 [phone]

## 2021-11-19 NOTE — ED Notes (Signed)
See triage note  Presents with mid abd pain   States he has had intermittent pain and this episode  started  this weekend  Denies any feeror n/v

## 2021-11-20 ENCOUNTER — Telehealth: Payer: Self-pay

## 2021-11-20 NOTE — Telephone Encounter (Signed)
Spoke to pt about ER Visit yesterday. He said he is feeling a little better today. He has not started the zpac because they told him to do a stool sample and bring it to the office. I looked and there were no future orders for any testing to be done. They told him they could not put in orders. He said he will call back if he feels he needs to be seen here to get the proper testing ordered and performed.

## 2021-11-21 NOTE — Telephone Encounter (Signed)
Left message on voicemail for patient to call the office back. 

## 2021-11-23 NOTE — Telephone Encounter (Signed)
Left detailed message on VM per DPR with Dr Alla German note.

## 2021-11-29 ENCOUNTER — Ambulatory Visit (AMBULATORY_SURGERY_CENTER): Payer: Self-pay

## 2021-11-29 ENCOUNTER — Telehealth: Payer: Self-pay

## 2021-11-29 VITALS — Ht 73.0 in | Wt 206.0 lb

## 2021-11-29 DIAGNOSIS — R1032 Left lower quadrant pain: Secondary | ICD-10-CM

## 2021-11-29 MED ORDER — NA SULFATE-K SULFATE-MG SULF 17.5-3.13-1.6 GM/177ML PO SOLN: 1.0000 | ORAL | 0 refills | Status: DC

## 2021-11-29 NOTE — Progress Notes (Signed)
No egg or soy allergy known to patient  No issues known to pt with past sedation with any surgeries or procedures Patient denies ever being told they had issues or difficulty with intubation  No FH of Malignant Hyperthermia Pt is not on diet pills Pt is not on  home 02  Pt is not on blood thinners  Pt denies issues with constipation  No A fib or A flutter Have any cardiac testing pending--denied Pt instructed to use [employer] or GoodRx for a price reduction on prep   

## 2021-11-29 NOTE — Telephone Encounter (Signed)
During PV interview (for colonoscopy for LLQ pain)pt asked about need for upper endoscopy in f/u to ED visit 8/7 for abdominal pain.  You have seen the patient for similar symptoms (02/2021). Is this pt ok for direct to [place]? Or OV? Please advise. Thanks, Aaron Edelman (PV)

## 2021-11-30 NOTE — Telephone Encounter (Signed)
Pt scheduled for double

## 2021-12-20 ENCOUNTER — Encounter: Payer: 59 | Admitting: Gastroenterology

## 2021-12-20 ENCOUNTER — Encounter: Payer: Self-pay | Admitting: Gastroenterology

## 2021-12-20 ENCOUNTER — Ambulatory Visit (AMBULATORY_SURGERY_CENTER): Payer: 59 | Admitting: Gastroenterology

## 2021-12-20 VITALS — BP 111/67 | HR 74 | Temp 98.7°F | Resp 11 | Ht 73.0 in | Wt 206.0 lb

## 2021-12-20 DIAGNOSIS — K319 Disease of stomach and duodenum, unspecified: Secondary | ICD-10-CM | POA: Diagnosis not present

## 2021-12-20 DIAGNOSIS — K5730 Diverticulosis of large intestine without perforation or abscess without bleeding: Secondary | ICD-10-CM | POA: Diagnosis not present

## 2021-12-20 DIAGNOSIS — R1032 Left lower quadrant pain: Secondary | ICD-10-CM | POA: Diagnosis not present

## 2021-12-20 DIAGNOSIS — R1084 Generalized abdominal pain: Secondary | ICD-10-CM

## 2021-12-20 DIAGNOSIS — K222 Esophageal obstruction: Secondary | ICD-10-CM

## 2021-12-20 DIAGNOSIS — D123 Benign neoplasm of transverse colon: Secondary | ICD-10-CM

## 2021-12-20 DIAGNOSIS — K2270 Barrett's esophagus without dysplasia: Secondary | ICD-10-CM | POA: Diagnosis not present

## 2021-12-20 DIAGNOSIS — K269 Duodenal ulcer, unspecified as acute or chronic, without hemorrhage or perforation: Secondary | ICD-10-CM | POA: Diagnosis not present

## 2021-12-20 MED ORDER — SODIUM CHLORIDE 0.9 % IV SOLN: 500.0000 mL | INTRAVENOUS | Status: DC

## 2021-12-20 MED ORDER — OMEPRAZOLE 40 MG PO CPDR: 40.0000 mg | DELAYED_RELEASE_CAPSULE | Freq: Every day | ORAL | 0 refills | Status: DC

## 2021-12-20 NOTE — Progress Notes (Signed)
Sedate, gd SR, tolerated procedure well, VSS, report to RN 

## 2021-12-20 NOTE — Op Note (Signed)
[facility] Patient Name: Ronald Jones Procedure Date: 12/20/2021 11:15 AM MRN: [id number] Endoscopist: Nicki Reaper E. Candis Schatz , MD Age: 41 Referring MD:  Date of Birth: [birth year] Gender: Male Account #: [id number] Procedure:                Colonoscopy Indications:              Abdominal pain in the left lower quadrant Medicines:                Monitored Anesthesia Care Procedure:                Pre-Anesthesia Assessment:                           - Prior to the procedure, a History and Physical                            was performed, and patient medications and                            allergies were reviewed. The patient's tolerance of                            previous anesthesia was also reviewed. The risks                            and benefits of the procedure and the sedation                            options and risks were discussed with the patient.                            All questions were answered, and informed consent                            was obtained. Prior Anticoagulants: The patient has                            taken no previous anticoagulant or antiplatelet                            agents. ASA Grade Assessment: II - A patient with                            mild systemic disease. After reviewing the risks                            and benefits, the patient was deemed in                            satisfactory condition to undergo the procedure.                           After obtaining informed consent, the colonoscope  was passed under direct vision. Throughout the                            procedure, the patient's blood pressure, pulse, and                            oxygen saturations were monitored continuously. The                            Olympus CF-HQ190L ([id number]) Colonoscope was                            introduced through the anus and advanced to the the                            cecum, identified by  appendiceal orifice and                            ileocecal valve. The colonoscopy was somewhat                            difficult due to significant looping. Successful                            completion of the procedure was aided by changing                            the patient to a supine position, using manual                            pressure and withdrawing and reinserting the scope.                            The patient tolerated the procedure, but did                            experience bradycardia from vasovagal activity                            related to abdominal pressure and scope insertion.                            He received glycopyrrolate. The quality of the                            bowel preparation was good. The ileocecal valve,                            appendiceal orifice, and rectum were photographed.                            The bowel preparation used was SUPREP via split  dose instruction. Scope In: 11:37:40 AM Scope Out: 12:01:27 PM Scope Withdrawal Time: 0 hours 18 minutes 52 seconds  Total Procedure Duration: 0 hours 23 minutes 47 seconds  Findings:                 The perianal and digital rectal examinations were                            normal. Pertinent negatives include normal                            sphincter tone and no palpable rectal lesions.                           A 5 mm polyp was found in the transverse colon. The                            polyp was sessile. The polyp was removed with a                            cold snare. Resection and retrieval were complete.                            Estimated blood loss was minimal.                           Multiple small and large-mouthed diverticula were                            found in the sigmoid colon and descending colon.                           The exam was otherwise normal throughout the                            examined colon.                            The retroflexed view of the distal rectum and anal                            verge was normal and showed no anal or rectal                            abnormalities. Complications:            No immediate complications. Estimated Blood Loss:     Estimated blood loss was minimal. Impression:               - One 5 mm polyp in the transverse colon, removed                            with a cold snare. Resected and retrieved.                           -  Diverticulosis in the sigmoid colon and in the                            descending colon.                           - The distal rectum and anal verge are normal on                            retroflexion view.                           - No abnormalities to explain patient's persistent                            left sided abdominal pain. Recommendation:           - Patient has a contact number available for                            emergencies. The signs and symptoms of potential                            delayed complications were discussed with the                            patient. Return to normal activities tomorrow.                            Written discharge instructions were provided to the                            patient.                           - High fiber diet. Consider Metamucil daily to                            reduce risk of complications from diverticulosis                            (diverticulitis, diverticular hemorrhage).                           - Continue present medications.                           - Await pathology results.                           - Repeat colonoscopy (date not yet determined) for                            surveillance based on pathology results. Kamare Caspers E. Candis Schatz, MD 12/20/2021 12:20:52 PM This report has been signed electronically.

## 2021-12-20 NOTE — Op Note (Signed)
[facility] Patient Name: Ronald Jones Procedure Date: 12/20/2021 11:16 AM MRN: [id number] Endoscopist: Nicki Reaper E. Candis Schatz , MD Age: 41 Referring MD:  Date of Birth: [birth year] Gender: Male Account #: [id number] Procedure:                Upper GI endoscopy Indications:              Epigastric abdominal pain Medicines:                Monitored Anesthesia Care Procedure:                Pre-Anesthesia Assessment:                           - Prior to the procedure, a History and Physical                            was performed, and patient medications and                            allergies were reviewed. The patient's tolerance of                            previous anesthesia was also reviewed. The risks                            and benefits of the procedure and the sedation                            options and risks were discussed with the patient.                            All questions were answered, and informed consent                            was obtained. Prior Anticoagulants: The patient has                            taken no previous anticoagulant or antiplatelet                            agents. ASA Grade Assessment: II - A patient with                            mild systemic disease. After reviewing the risks                            and benefits, the patient was deemed in                            satisfactory condition to undergo the procedure.                           After obtaining informed consent, the endoscope was  passed under direct vision. Throughout the                            procedure, the patient's blood pressure, pulse, and                            oxygen saturations were monitored continuously. The                            GIF [id number] #[id number] was introduced through the                            mouth, and advanced to the third part of duodenum.                            The upper GI endoscopy was  accomplished without                            difficulty. The patient tolerated the procedure                            well. Scope In: Scope Out: Findings:                 The examined portions of the nasopharynx,                            oropharynx and larynx were normal.                           Three tongues of salmon-colored mucosa were present                            from 37 to 38 cm. No other visible abnormalities                            were present. The maximum longitudinal extent of                            these esophageal mucosal changes was 1 cm in                            length. Mucosa was biopsied with a cold forceps for                            histology. One specimen bottle was sent to                            pathology. Estimated blood loss was minimal.                           The exam of the esophagus was otherwise normal.                           The  entire examined stomach was normal. Biopsies                            were taken with a cold forceps for Helicobacter                            pylori testing. Estimated blood loss was minimal.                           One non-bleeding superficial linear duodenal ulcer                            with no stigmata of bleeding was found in the                            second portion of the duodenum. The lesion was                            friable and there appeared to be a mild degree of                            stenosis. Given the location in the duodenal sweep,                            visualization was suboptimal. The lesion was 4 mm                            in largest dimension. Biopsies were taken with a                            cold forceps for histology. Estimated blood loss                            was minimal.                           The exam of the duodenum was otherwise normal. Complications:            No immediate complications. Estimated Blood Loss:     Estimated  blood loss was minimal. Impression:               - The examined portions of the nasopharynx,                            oropharynx and larynx were normal.                           - Salmon-colored mucosa suspicious for                            short-segment Barrett's esophagus. Biopsied.                           - Normal stomach. Biopsied.                           -  Questionable non-bleeding duodenal ulcer with no                            stigmata of bleeding. Biopsied. Recommendation:           - Patient has a contact number available for                            emergencies. The signs and symptoms of potential                            delayed complications were discussed with the                            patient. Return to normal activities tomorrow.                            Written discharge instructions were provided to the                            patient.                           - Resume previous diet.                           - Continue present medications.                           - Await pathology results.                           - Use Prilosec (omeprazole) 40 mg PO daily for 4                            weeks to heal duodenal ulcer. If Barrett's                            esophagus is confirmed, indefinite therapy may be                            indicated. Jeniyah Menor E. Candis Schatz, MD 12/20/2021 12:13:26 PM This report has been signed electronically.

## 2021-12-20 NOTE — Patient Instructions (Addendum)
Read all of the handouts given to you by your recovery room nurse.  Take your medicine 1/2  hour before breakfast.  Do not take it on a full stomach.  If you do have Barrett's, you will probably have to continue your medicine.  YOU HAD AN ENDOSCOPIC PROCEDURE TODAY AT [facility]:   Refer to the procedure report that was given to you for any specific questions about what was found during the examination.  If the procedure report does not answer your questions, please call your gastroenterologist to clarify.  If you requested that your care partner not be given the details of your procedure findings, then the procedure report has been included in a sealed envelope for you to review at your convenience later.  YOU SHOULD EXPECT: Some feelings of bloating in the abdomen. Passage of more gas than usual.  Walking can help get rid of the air that was put into your GI tract during the procedure and reduce the bloating. If you had a lower endoscopy (such as a colonoscopy or flexible sigmoidoscopy) you may notice spotting of blood in your stool or on the toilet paper. If you underwent a bowel prep for your procedure, you may not have a normal bowel movement for a few days.  Please Note:  You might notice some irritation and congestion in your nose or some drainage.  This is from the oxygen used during your procedure.  There is no need for concern and it should clear up in a day or so.  SYMPTOMS TO REPORT IMMEDIATELY:  Following lower endoscopy (colonoscopy or flexible sigmoidoscopy):  Excessive amounts of blood in the stool  Significant tenderness or worsening of abdominal pains  Swelling of the abdomen that is new, acute  Fever of 100F or higher  Following upper endoscopy (EGD)  Vomiting of blood or coffee ground material  New chest pain or pain under the shoulder blades  Painful or persistently difficult swallowing  New shortness of breath  Fever of 100F or higher  Black,  tarry-looking stools  For urgent or emergent issues, a gastroenterologist can be reached at any hour by calling [phone]. Do not use MyChart messaging for urgent concerns.    DIET:  We do recommend a small meal at first, but then you may proceed to your regular diet.  Drink plenty of fluids but you should avoid alcoholic beverages for 24 hours. Try to eat a high fiber diet, and drink plenty of water.  You might want to try Metamucil every day for increased fiber too.  ACTIVITY:  You should plan to take it easy for the rest of today and you should NOT DRIVE or use heavy machinery until tomorrow (because of the sedation medicines used during the test).    FOLLOW UP: Our staff will call the number listed on your records the next business day following your procedure.  We will call around 7:15- 8:00 am to check on you and address any questions or concerns that you may have regarding the information given to you following your procedure. If we do not reach you, we will leave a message.  If you develop any symptoms (ie: fever, flu-like symptoms, shortness of breath, cough etc.) before then, please call [phone].  If you test positive for Covid 19 in the 2 weeks post procedure, please call and report this information to [place].    If any biopsies were taken you will be contacted by phone or by letter within the  next 1-3 weeks.  Please call us at [phone] if you have not heard about the biopsies in 3 weeks.    SIGNATURES/CONFIDENTIALITY: You and/or your care partner have signed paperwork which will be entered into your electronic medical record.  These signatures attest to the fact that that the information above on your After Visit Summary has been reviewed and is understood.  Full responsibility of the confidentiality of this discharge information lies with you and/or your care-partner.

## 2021-12-20 NOTE — Progress Notes (Signed)
Called to room to assist during endoscopic procedure.  Patient ID and intended procedure confirmed with present staff. Received instructions for my participation in the procedure from the performing physician.  

## 2021-12-20 NOTE — Progress Notes (Signed)
[facility] History and Physical   Primary Care Physician:  Venia Carbon, MD   Reason for Procedure:   Left lower quadrant abdominal pain, epigastric pain  Plan:    EGD, colonoscopy     HPI: Ronald Jones is a 41 y.o. male undergoing EGD and colonoscopy to evaluate a persistent LLQ abdominal pain and a newer, more severe epigastric pain.  CT showed splenomegaly, otherwise normal.  Bowel habits are normal.  He has lost 10-15 lbs recently without meaning to.  He has no family history of colon cancer.    Past Medical History:  Diagnosis Date   Diverticulosis    GERD (gastroesophageal reflux disease)    IBS (irritable bowel syndrome)    Splenomegaly    Thrombocytopenia ([place])     Past Surgical History:  Procedure Laterality Date   COSMETIC SURGERY     surgery on face as a child, dog bite   NEVUS EXCISION     2 removed--benign   WISDOM TOOTH EXTRACTION  09/08    Prior to Admission medications   Medication Sig Start Date End Date Taking? Authorizing Provider  dicyclomine (BENTYL) 10 MG capsule Take 2 capsules (20 mg total) by mouth 3 (three) times daily before meals for 20 days. 11/19/21 12/09/21  Menshew, Dannielle Karvonen, PA-C    Current Outpatient Medications  Medication Sig Dispense Refill   dicyclomine (BENTYL) 10 MG capsule Take 2 capsules (20 mg total) by mouth 3 (three) times daily before meals for 20 days. 120 capsule 0   Current Facility-Administered Medications  Medication Dose Route Frequency Provider Last Rate Last Admin   0.9 %  sodium chloride infusion  500 mL Intravenous Continuous Daryel November, MD        Allergies as of 12/20/2021   (No Known Allergies)    Family History  Problem Relation Age of Onset   Heart disease Mother    Colon polyps Father    Prostate cancer Father    Diabetes Father    Heart disease Father    Testicular cancer Brother    Coronary artery disease Maternal Grandfather    Esophageal cancer Paternal  Grandfather    Cancer Paternal Grandfather    Colon cancer Neg Hx    Rectal cancer Neg Hx    Stomach cancer Neg Hx     Social History   Socioeconomic History   Marital status: Married    Spouse name: Not on file   Number of children: 1   Years of education: Not on file   Highest education level: Not on file  Occupational History   Occupation: Dealer   Occupation: Merchant navy officer  Tobacco Use   Smoking status: Former    Types: Cigarettes   Smokeless tobacco: Never  Scientific laboratory technician Use: Never used  Substance and Sexual Activity   Alcohol use: Not Currently   Drug use: No   Sexual activity: Yes  Other Topics Concern   Not on file  Social History Narrative   Son born 2016   Social Determinants of Health   Financial Resource Strain: Not on file  Food Insecurity: Not on file  Transportation Needs: Not on file  Physical Activity: Not on file  Stress: Not on file  Social Connections: Not on file  Intimate Partner Violence: Not on file    Review of Systems:  All other review of systems negative except as mentioned in the HPI.  Physical Exam: Vital signs BP 132/87   Pulse  74   Temp 98.7 F (37.1 C)   Ht '6\' 1"'$  (1.854 m)   Wt 206 lb (93.4 kg)   SpO2 100%   BMI 27.18 kg/m   General:   Alert,  Well-developed, well-nourished, pleasant and cooperative in NAD Airway:  Mallampati 2 Lungs:  Clear throughout to auscultation.   Heart:  Regular rate and rhythm; no murmurs, clicks, rubs,  or gallops. Abdomen:  Soft, nontender and nondistended. Normal bowel sounds.   Neuro/Psych:  Normal mood and affect. A and O x 3   Ronald Jones E. Candis Schatz, MD [facility]

## 2021-12-21 ENCOUNTER — Telehealth: Payer: Self-pay | Admitting: *Deleted

## 2021-12-21 NOTE — Telephone Encounter (Signed)
Attempted to call patient for their post-procedure follow-up call. No answer. Left voicemail.   

## 2021-12-24 NOTE — Progress Notes (Signed)
Mr. Dzikowski, The biopsies taken from your stomach were notable for mild reactive gastropathy which is a common finding and often related to use of certain medications (usually NSAIDs), but there was no evidence of Helicobacter pylori infection. This common finding is not felt to necessarily be a cause of any particular symptom and there is no specific treatment or further evaluation recommended.  The biopsies of the linear ulcer in your duodenum came back as gastric tissue.  This was surprising, and makes me wonder if there had been a sampling error. I think it would be reasonable to repeat an upper endoscopy in 6 to 8 weeks to reassess this area and repeat biopsies, especially if you are still having abdominal discomfort. We will contact you to schedule this procedure.  If you have concerns or questions about this, please let us know.  The biopsies of your esophagus confirmed my suspicion for Barrett's esophagus.  This is a condition related to chronic acid reflux, causing changes in the esophagus that are associated with an increased risk of esophageal cancer.  Your overall risk for esophageal cancer is very low, but it is recommended that you undergo periodic surveillance to reexamine this area. Current guidelines recommend repeating an upper endoscopy in 3 to 5 years.  It is also recommended that you continue to take a medicine such as omeprazole (proton pump inhibitor) indefinitely.  The polyp which I removed during your colonoscopy was proven to be completely benign but is considered a "pre-cancerous" polyp that MAY have grown into cancer if it had not been removed.  Studies shows that at least 20% of women over age 22 and 30% of men over age 53 have pre-cancerous polyps.  Based on current nationally recognized surveillance guidelines, I recommend that you have a repeat colonoscopy in 7 years.   If you develop any new rectal bleeding, abdominal pain or significant bowel habit changes, please contact  me before then.

## 2021-12-27 ENCOUNTER — Other Ambulatory Visit: Payer: Self-pay | Admitting: Nurse Practitioner

## 2021-12-27 DIAGNOSIS — R1084 Generalized abdominal pain: Secondary | ICD-10-CM

## 2021-12-27 DIAGNOSIS — R161 Splenomegaly, not elsewhere classified: Secondary | ICD-10-CM

## 2021-12-28 ENCOUNTER — Ambulatory Visit
Admission: RE | Admit: 2021-12-28 | Discharge: 2021-12-28 | Disposition: A | Discharge status: Home / Self Care | Payer: 59 | Source: Ambulatory Visit | Attending: Nurse Practitioner | Admitting: Nurse Practitioner

## 2021-12-28 DIAGNOSIS — R1084 Generalized abdominal pain: Secondary | ICD-10-CM | POA: Diagnosis present

## 2021-12-28 DIAGNOSIS — R161 Splenomegaly, not elsewhere classified: Secondary | ICD-10-CM | POA: Insufficient documentation

## 2022-01-01 ENCOUNTER — Ambulatory Visit: Payer: 59 | Admitting: Oncology

## 2022-01-01 ENCOUNTER — Other Ambulatory Visit: Payer: 59

## 2022-01-04 NOTE — Progress Notes (Unsigned)
[facility]  Telephone:([phone]) [phone] Fax:(336) [phone]  ID: Ronald Jones OB: 02/01/1981  MR#: [id number]  TOI#:[id number]  Patient Care Team: Venia Carbon, MD as PCP - General  CHIEF COMPLAINT: Splenomegaly.  INTERVAL HISTORY: Patient returns to clinic today for repeat laboratory work, discussion of his imaging results, and further evaluation.  He was recently diagnosed with an ulcer and since starting PPI his left flank symptoms have seem to have resolved.  He currently feels well.  He has no neurologic complaints.  He denies any recent fevers or illnesses.  He has a good appetite and denies weight loss.  He has no chest pain, shortness of breath, cough or hemoptysis.  He denies any nausea, vomiting, constipation, or diarrhea.  He has no melena or hematochezia.  He has no urinary complaints.  Patient offers no specific complaints today.  REVIEW OF SYSTEMS:   Review of Systems  Constitutional: Negative.  Negative for fever, malaise/fatigue and weight loss.  Respiratory: Negative.  Negative for cough, hemoptysis and shortness of breath.   Cardiovascular: Negative.  Negative for chest pain and leg swelling.  Gastrointestinal: Negative.  Negative for abdominal pain, blood in stool and melena.  Genitourinary: Negative.  Negative for dysuria.  Musculoskeletal: Negative.  Negative for back pain.  Skin: Negative.  Negative for rash.  Neurological: Negative.  Negative for dizziness, focal weakness, weakness and headaches.  Psychiatric/Behavioral: Negative.  The patient is not nervous/anxious.     As per HPI. Otherwise, a complete review of systems is negative.  PAST MEDICAL HISTORY: Past Medical History:  Diagnosis Date   Diverticulosis    GERD (gastroesophageal reflux disease)    IBS (irritable bowel syndrome)    Splenomegaly    Thrombocytopenia ([facility])     PAST SURGICAL HISTORY: Past Surgical History:  Procedure Laterality Date   COSMETIC SURGERY     surgery  on face as a child, dog bite   NEVUS EXCISION     2 removed--benign   WISDOM TOOTH EXTRACTION  09/08    FAMILY HISTORY: Family History  Problem Relation Age of Onset   Heart disease Mother    Colon polyps Father    Prostate cancer Father    Diabetes Father    Heart disease Father    Testicular cancer Brother    Coronary artery disease Maternal Grandfather    Esophageal cancer Paternal Grandfather    Cancer Paternal Grandfather    Colon cancer Neg Hx    Rectal cancer Neg Hx    Stomach cancer Neg Hx     ADVANCED DIRECTIVES (Y/N):  N  HEALTH MAINTENANCE: Social History   Tobacco Use   Smoking status: Former    Types: Cigarettes   Smokeless tobacco: Never  Vaping Use   Vaping Use: Never used  Substance Use Topics   Alcohol use: Not Currently   Drug use: No     Colonoscopy:  PAP:  Bone density:  Lipid panel:  No Known Allergies  Current Outpatient Medications  Medication Sig Dispense Refill   omeprazole (PRILOSEC) 40 MG capsule Take 1 capsule (40 mg total) by mouth daily. 210 capsule 0   No current facility-administered medications for this visit.    OBJECTIVE: Vitals:   01/08/22 1102  BP: 121/87  Pulse: 68  Temp: (!) 97.2 F (36.2 C)     Body mass index is 27.84 kg/m.    ECOG FS:0 - Asymptomatic  General: Well-developed, well-nourished, no acute distress. Eyes: Pink conjunctiva, anicteric sclera. HEENT: Normocephalic,  moist mucous membranes. Lungs: No audible wheezing or coughing. Heart: Regular rate and rhythm. Abdomen: Soft, nontender, no obvious distention. Musculoskeletal: No edema, cyanosis, or clubbing. Neuro: Alert, answering all questions appropriately. Cranial nerves grossly intact. Skin: No rashes or petechiae noted. Psych: Normal affect.  LAB RESULTS:  Lab Results  Component Value Date   NA 137 01/08/2022   K 3.9 01/08/2022   CL 108 01/08/2022   CO2 26 01/08/2022   GLUCOSE 93 01/08/2022   BUN 14 01/08/2022   CREATININE 1.26  (H) 01/08/2022   CALCIUM 8.7 (L) 01/08/2022   PROT 7.2 01/08/2022   ALBUMIN 4.5 01/08/2022   AST 20 01/08/2022   ALT 21 01/08/2022   ALKPHOS 50 01/08/2022   BILITOT 1.5 (H) 01/08/2022   GFRNONAA >60 01/08/2022    Lab Results  Component Value Date   WBC 5.0 01/08/2022   NEUTROABS 3.2 01/08/2022   HGB 14.0 01/08/2022   HCT 39.6 01/08/2022   MCV 84.6 01/08/2022   PLT 128 (L) 01/08/2022     STUDIES: US Abdomen Complete  Result Date: 12/29/2021 CLINICAL DATA:  Splenomegaly and abdominal pain. EXAM: ABDOMEN ULTRASOUND COMPLETE COMPARISON:  CT abdomen and pelvis 11/19/2021. Ultrasound abdomen 06/25/2021. FINDINGS: Gallbladder: No gallstones or wall thickening visualized. No sonographic Murphy sign noted by sonographer. Common bile duct: Diameter: 4.6 mm Liver: No focal lesion identified. Within normal limits in parenchymal echogenicity. Portal vein is patent on color Doppler imaging with normal direction of blood flow towards the liver. IVC: No abnormality visualized. Pancreas: Not well seen secondary to overlying bowel gas. Spleen: Enlarged measuring up to 15.9 cm similar to prior. Right Kidney: Length: 15.9. Echogenicity within normal limits. No mass or hydronephrosis visualized. Left Kidney: Length: 11.4. Echogenicity within normal limits. No hydronephrosis. There is a cyst in the upper pole measuring 2.0 x 2.6 x 2.4 cm. This is similar to prior. Abdominal aorta: Limited visualization secondary to overlying bowel gas. Proximal portion measures 2.4 cm. Other findings: None. IMPRESSION: 1. Stable splenomegaly. 2. Stable left renal cysts. 3. Limited evaluation of the aorta and pancreas. Electronically Signed   By: Ronney Asters M.D.   On: 12/29/2021 19:31    ASSESSMENT: Splenomegaly.  PLAN:    Splenomegaly: CT scan results from March 07, 2021 reviewed independently with mild splenomegaly.  Repeat abdominal ultrasound on December 29, 2021 reviewed independently and reported as above with  unchanged splenomegaly.  Patient's flank pain has resolved with treatment of an underlying ulcer.  Although the etiology of his splenomegaly is unknown, suspected clinically insignificant.  No intervention is needed at this time.  No further follow-up has been scheduled.   Thrombocytopenia: Chronic and unchanged.  Possibly related to underlying mild splenomegaly.  Recommend PCP check CBC once per year. Gastric ulcer: Continue PPI as per GI.  Patient expressed understanding and was in agreement with this plan. He also understands that He can call clinic at any time with any questions, concerns, or complaints.    Lloyd Huger, MD   01/08/2022 2:14 PM

## 2022-01-07 ENCOUNTER — Other Ambulatory Visit: Payer: Self-pay

## 2022-01-07 DIAGNOSIS — D696 Thrombocytopenia, unspecified: Secondary | ICD-10-CM

## 2022-01-08 ENCOUNTER — Encounter: Payer: Self-pay | Admitting: Oncology

## 2022-01-08 ENCOUNTER — Inpatient Hospital Stay: Payer: 59 | Attending: Oncology | Admitting: Oncology

## 2022-01-08 ENCOUNTER — Inpatient Hospital Stay: Payer: 59

## 2022-01-08 VITALS — BP 121/87 | HR 68 | Temp 97.2°F | Ht 73.0 in | Wt 211.0 lb

## 2022-01-08 DIAGNOSIS — Z87891 Personal history of nicotine dependence: Secondary | ICD-10-CM | POA: Diagnosis not present

## 2022-01-08 DIAGNOSIS — D696 Thrombocytopenia, unspecified: Secondary | ICD-10-CM

## 2022-01-08 DIAGNOSIS — R161 Splenomegaly, not elsewhere classified: Secondary | ICD-10-CM | POA: Diagnosis not present

## 2022-01-08 DIAGNOSIS — K259 Gastric ulcer, unspecified as acute or chronic, without hemorrhage or perforation: Secondary | ICD-10-CM | POA: Diagnosis not present

## 2022-01-08 LAB — CBC WITH DIFFERENTIAL/PLATELET
Abs Immature Granulocytes: 0.02 10*3/uL (ref 0.00–0.07)
Basophils Absolute: 0 10*3/uL (ref 0.0–0.1)
Basophils Relative: 1 %
Eosinophils Absolute: 0.1 10*3/uL (ref 0.0–0.5)
Eosinophils Relative: 2 %
HCT: 39.6 % (ref 39.0–52.0)
Hemoglobin: 14 g/dL (ref 13.0–17.0)
Immature Granulocytes: 0 %
Lymphocytes Relative: 25 %
Lymphs Abs: 1.3 10*3/uL (ref 0.7–4.0)
MCH: 29.9 pg (ref 26.0–34.0)
MCHC: 35.4 g/dL (ref 30.0–36.0)
MCV: 84.6 fL (ref 80.0–100.0)
Monocytes Absolute: 0.4 10*3/uL (ref 0.1–1.0)
Monocytes Relative: 9 %
Neutro Abs: 3.2 10*3/uL (ref 1.7–7.7)
Neutrophils Relative %: 63 %
Platelets: 128 10*3/uL — ABNORMAL LOW (ref 150–400)
RBC: 4.68 MIL/uL (ref 4.22–5.81)
RDW: 13.1 % (ref 11.5–15.5)
WBC: 5 10*3/uL (ref 4.0–10.5)
nRBC: 0 % (ref 0.0–0.2)

## 2022-01-08 LAB — COMPREHENSIVE METABOLIC PANEL
ALT: 21 U/L (ref 0–44)
AST: 20 U/L (ref 15–41)
Albumin: 4.5 g/dL (ref 3.5–5.0)
Alkaline Phosphatase: 50 U/L (ref 38–126)
Anion gap: 3 — ABNORMAL LOW (ref 5–15)
BUN: 14 mg/dL (ref 6–20)
CO2: 26 mmol/L (ref 22–32)
Calcium: 8.7 mg/dL — ABNORMAL LOW (ref 8.9–10.3)
Chloride: 108 mmol/L (ref 98–111)
Creatinine, Ser: 1.26 mg/dL — ABNORMAL HIGH (ref 0.61–1.24)
GFR, Estimated: >60 mL/min (ref >60)
Glucose, Bld: 93 mg/dL (ref 70–99)
Potassium: 3.9 mmol/L (ref 3.5–5.1)
Sodium: 137 mmol/L (ref 135–145)
Total Bilirubin: 1.5 mg/dL — ABNORMAL HIGH (ref 0.3–1.2)
Total Protein: 7.2 g/dL (ref 6.5–8.1)

## 2022-01-08 LAB — LACTATE DEHYDROGENASE: LDH: 127 U/L (ref 98–192)

## 2022-01-10 LAB — COMP PANEL: LEUKEMIA/LYMPHOMA

## 2022-03-06 ENCOUNTER — Encounter: Payer: Self-pay | Admitting: Gastroenterology

## 2022-07-16 ENCOUNTER — Other Ambulatory Visit: Payer: Self-pay | Admitting: Gastroenterology

## 2022-07-16 DIAGNOSIS — R1084 Generalized abdominal pain: Secondary | ICD-10-CM

## 2022-07-16 DIAGNOSIS — R1032 Left lower quadrant pain: Secondary | ICD-10-CM

## 2022-09-13 ENCOUNTER — Other Ambulatory Visit: Payer: Self-pay | Admitting: Gastroenterology

## 2022-09-13 DIAGNOSIS — R1084 Generalized abdominal pain: Secondary | ICD-10-CM

## 2022-09-13 DIAGNOSIS — R1032 Left lower quadrant pain: Secondary | ICD-10-CM

## 2022-11-21 ENCOUNTER — Other Ambulatory Visit: Payer: Self-pay | Admitting: Gastroenterology

## 2022-11-21 DIAGNOSIS — R1032 Left lower quadrant pain: Secondary | ICD-10-CM

## 2022-11-21 DIAGNOSIS — R1084 Generalized abdominal pain: Secondary | ICD-10-CM

## 2023-01-16 ENCOUNTER — Other Ambulatory Visit: Payer: Self-pay | Admitting: Gastroenterology

## 2023-01-16 DIAGNOSIS — R1032 Left lower quadrant pain: Secondary | ICD-10-CM

## 2023-01-16 DIAGNOSIS — R1084 Generalized abdominal pain: Secondary | ICD-10-CM

## 2023-06-24 ENCOUNTER — Emergency Department

## 2023-06-24 ENCOUNTER — Other Ambulatory Visit: Payer: Self-pay

## 2023-06-24 DIAGNOSIS — R109 Unspecified abdominal pain: Secondary | ICD-10-CM | POA: Diagnosis present

## 2023-06-24 DIAGNOSIS — N200 Calculus of kidney: Secondary | ICD-10-CM | POA: Diagnosis not present

## 2023-06-24 MED ORDER — OXYCODONE-ACETAMINOPHEN 5-325 MG PO TABS
1.0000 | ORAL_TABLET | Freq: Once | ORAL | Status: AC
  Administered 2023-06-24: 1 via ORAL
  Filled 2023-06-24: qty 1

## 2023-06-24 NOTE — ED Triage Notes (Signed)
 Pt to ED via EMS from home, pt reports right side flank pain that began aprox 3 hours ago. Pt states he had one episode of vomiting.Pt denies hx kidney stones. Pt states he has had decreased urine output.

## 2023-06-25 ENCOUNTER — Emergency Department
Admission: EM | Admit: 2023-06-25 | Discharge: 2023-06-25 | Disposition: A | Discharge status: Home / Self Care | Attending: Emergency Medicine | Admitting: Emergency Medicine

## 2023-06-25 DIAGNOSIS — N200 Calculus of kidney: Secondary | ICD-10-CM

## 2023-06-25 LAB — CBC WITH DIFFERENTIAL/PLATELET
Abs Immature Granulocytes: 0.04 10*3/uL (ref 0.00–0.07)
Basophils Absolute: 0 10*3/uL (ref 0.0–0.1)
Basophils Relative: 0 %
Eosinophils Absolute: 0 10*3/uL (ref 0.0–0.5)
Eosinophils Relative: 1 %
HCT: 39 % (ref 39.0–52.0)
Hemoglobin: 13.8 g/dL (ref 13.0–17.0)
Immature Granulocytes: 1 %
Lymphocytes Relative: 10 %
Lymphs Abs: 0.8 10*3/uL (ref 0.7–4.0)
MCH: 29.6 pg (ref 26.0–34.0)
MCHC: 35.4 g/dL (ref 30.0–36.0)
MCV: 83.5 fL (ref 80.0–100.0)
Monocytes Absolute: 0.4 10*3/uL (ref 0.1–1.0)
Monocytes Relative: 6 %
Neutro Abs: 6.3 10*3/uL (ref 1.7–7.7)
Neutrophils Relative %: 82 %
Platelets: 125 10*3/uL — ABNORMAL LOW (ref 150–400)
RBC: 4.67 MIL/uL (ref 4.22–5.81)
RDW: 13.2 % (ref 11.5–15.5)
WBC: 7.6 10*3/uL (ref 4.0–10.5)
nRBC: 0 % (ref 0.0–0.2)

## 2023-06-25 LAB — BASIC METABOLIC PANEL
Anion gap: 10 (ref 5–15)
BUN: 20 mg/dL (ref 6–20)
CO2: 18 mmol/L — ABNORMAL LOW (ref 22–32)
Calcium: 9 mg/dL (ref 8.9–10.3)
Chloride: 108 mmol/L (ref 98–111)
Creatinine, Ser: 1.85 mg/dL — ABNORMAL HIGH (ref 0.61–1.24)
GFR, Estimated: 46 mL/min — ABNORMAL LOW (ref >60)
Glucose, Bld: 145 mg/dL — ABNORMAL HIGH (ref 70–99)
Potassium: 3.4 mmol/L — ABNORMAL LOW (ref 3.5–5.1)
Sodium: 136 mmol/L (ref 135–145)

## 2023-06-25 LAB — URINALYSIS, ROUTINE W REFLEX MICROSCOPIC
Bacteria, UA: NONE SEEN
Bilirubin Urine: NEGATIVE
Glucose, UA: 50 mg/dL — AB
Ketones, ur: NEGATIVE mg/dL
Leukocytes,Ua: NEGATIVE
Nitrite: NEGATIVE
Protein, ur: NEGATIVE mg/dL
Specific Gravity, Urine: 1.02 (ref 1.005–1.030)
Squamous Epithelial / HPF: 0 /HPF (ref 0–5)
pH: 5 (ref 5.0–8.0)

## 2023-06-25 NOTE — Discharge Instructions (Addendum)
 Imaging today showed evidence of a kidney stone on your right side which is now passed into your bladder.  This is the source of your symptoms today.  Otherwise no sign of infection anywhere.  You may feel slight pain when you eventually pee out the stone but otherwise nothing concerning.  Elsewise, your creatinine function which is a marker of your kidney is slightly elevated today.  This should likely improve with hydration at home but I would like you to follow-up with your primary care provider in 1 to 2 weeks to recheck this lab to ensure improvement.

## 2023-06-25 NOTE — ED Provider Notes (Signed)
 [facility] Provider Note    Event Date/Time   First MD Initiated Contact with Patient 06/25/23 [phone]     (approximate)   History   Flank Pain   HPI Ronald Jones is a 43 y.o. male presenting today for flank pain.  Patient states approximately 3 hours before arrival he had onset of sharp right-sided flank pain.  1 episode of vomiting with this.  Otherwise denies fever, chills, chest pain, shortness of breath, other abdominal pain, dysuria, hematuria, constipation.  No prior history of kidney stones.  No other chronic medical problems.  No prior abdominal surgeries.     Physical Exam   Triage Vital Signs: ED Triage Vitals  Encounter Vitals Group     BP 06/24/23 2346 (!) 137/93     Systolic BP Percentile --      Diastolic BP Percentile --      Pulse Rate 06/24/23 2346 84     Resp 06/24/23 2346 18     Temp 06/24/23 2346 98.4 F (36.9 C)     Temp Source 06/24/23 2346 Oral     SpO2 06/24/23 2346 99 %     Weight 06/24/23 2346 218 lb (98.9 kg)     Height 06/24/23 2346 6\' 1"  (1.854 m)     Head Circumference --      Peak Flow --      Pain Score 06/24/23 2345 5     Pain Loc --      Pain Education --      Exclude from Growth Chart --     Most recent vital signs: Vitals:   06/24/23 2346  BP: (!) 137/93  Pulse: 84  Resp: 18  Temp: 98.4 F (36.9 C)  SpO2: 99%   Physical Exam: I have reviewed the vital signs and nursing notes. General: Awake, alert, no acute distress.  Nontoxic appearing. Head:  Atraumatic, normocephalic.   ENT:  EOM intact, PERRL. Oral mucosa is pink and moist with no lesions. Neck: Neck is supple with full range of motion, No meningeal signs. Cardiovascular:  RRR, No murmurs. Peripheral pulses palpable and equal bilaterally. Respiratory:  Symmetrical chest wall expansion.  No rhonchi, rales, or wheezes.  Good air movement throughout.  No use of accessory muscles.   Musculoskeletal:  No cyanosis or edema. Moving extremities with  full ROM Abdomen:  Soft, nontender, nondistended. Neuro:  GCS 15, moving all four extremities, interacting appropriately. Speech clear. Psych:  Calm, appropriate.   Skin:  Warm, dry, no rash.    ED Results / Procedures / Treatments   Labs (all labs ordered are listed, but only abnormal results are displayed) Labs Reviewed  CBC WITH DIFFERENTIAL/PLATELET - Abnormal; Notable for the following components:      Result Value   Platelets 125 (*)    All other components within normal limits  BASIC METABOLIC PANEL - Abnormal; Notable for the following components:   Potassium 3.4 (*)    CO2 18 (*)    Glucose, Bld 145 (*)    Creatinine, Ser 1.85 (*)    GFR, Estimated 46 (*)    All other components within normal limits  URINALYSIS, ROUTINE W REFLEX MICROSCOPIC - Abnormal; Notable for the following components:   Color, Urine YELLOW (*)    APPearance CLEAR (*)    Glucose, UA 50 (*)    Hgb urine dipstick LARGE (*)    All other components within normal limits  URINE CULTURE     EKG  RADIOLOGY Independently interpreted CT imaging with evidence of stone in the bladder and some right sided nephritic stranding possibly indicating passed stone   PROCEDURES:  Critical Care performed: No  Procedures   MEDICATIONS ORDERED IN ED: Medications  oxyCODONE-acetaminophen (PERCOCET/ROXICET) 5-325 MG per tablet 1 tablet (1 tablet Oral Given 06/24/23 2349)     IMPRESSION / MDM / ASSESSMENT AND PLAN / ED COURSE  I reviewed the triage vital signs and the nursing notes.                              Differential diagnosis includes, but is not limited to, nephrolithiasis, appendicitis, pyelonephritis, acute cystitis  Patient's presentation is most consistent with acute complicated illness / injury requiring diagnostic workup.  Patient is a 43 year old male presenting today for acute onset right-sided flank pain.  By the time of my arrival to the room, he notes symptomatic improvement.  CT  imaging shows evidence of stone in the bladder suspecting likely that he passed it shortly after arrival here.  UA with no signs of infection to indicate pyelonephritis.  He does have a mild AKI but suspect this is in the setting of initial stone.  Otherwise tolerating p.o. fluids without issue.  Patient safe for discharge and will have him follow-up with his PCP in 1 week to recheck his creatinine function and encouraged significant oral rehydration.  He was agreeable with plan and safe for discharge.     FINAL CLINICAL IMPRESSION(S) / ED DIAGNOSES   Final diagnoses:  Nephrolithiasis     Rx / DC Orders   ED Discharge Orders     None        Note:  This document was prepared using Dragon voice recognition software and may include unintentional dictation errors.   Janith Lima, MD 06/25/23 [phone]

## 2023-06-26 LAB — URINE CULTURE: Culture: <10000 — AB

## 2023-07-24 ENCOUNTER — Other Ambulatory Visit: Payer: Self-pay | Admitting: Gastroenterology

## 2023-07-24 DIAGNOSIS — R1084 Generalized abdominal pain: Secondary | ICD-10-CM

## 2023-07-24 DIAGNOSIS — R1032 Left lower quadrant pain: Secondary | ICD-10-CM

## 2023-07-26 ENCOUNTER — Other Ambulatory Visit: Payer: Self-pay | Admitting: Gastroenterology

## 2023-07-26 DIAGNOSIS — R1032 Left lower quadrant pain: Secondary | ICD-10-CM

## 2023-07-26 DIAGNOSIS — R1084 Generalized abdominal pain: Secondary | ICD-10-CM

## 2023-10-19 IMAGING — US US ABDOMEN COMPLETE
2 series · 14 of 25 positions shown · non-contrast
Comparison: CT March 07, 2021

CLINICAL DATA: Splenomegaly

EXAM:
ABDOMEN ULTRASOUND COMPLETE

[Series 1: us abdomen complete · 13 of 89 slices shown]
[im 1/89]
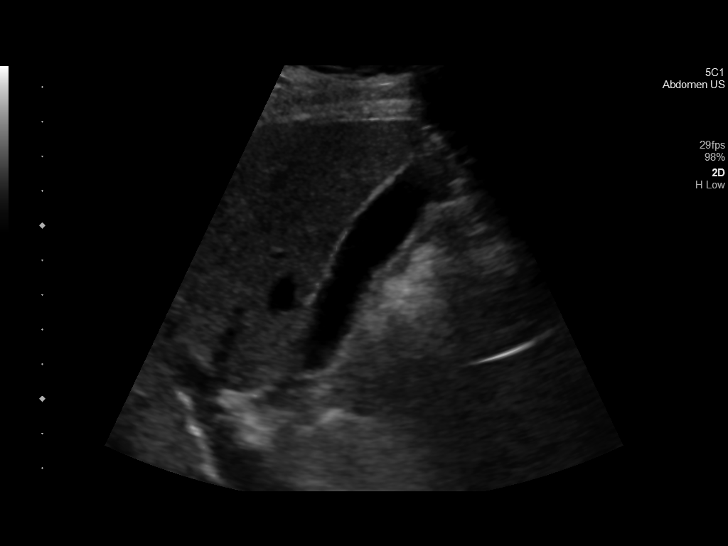
[im 8/89]
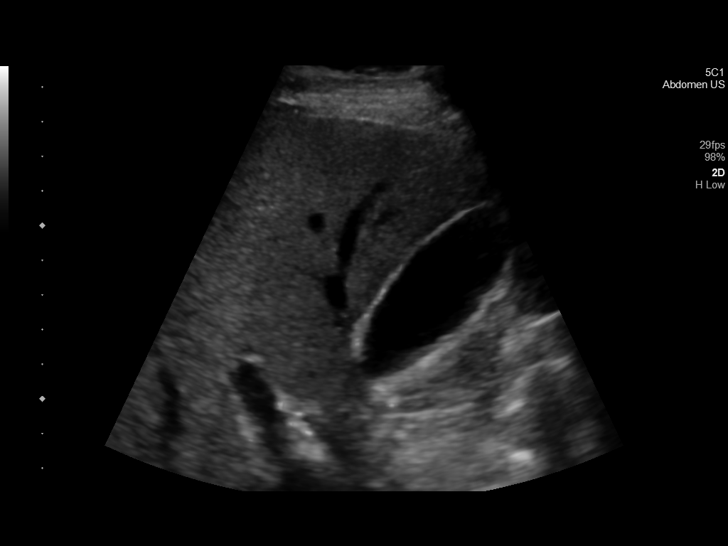
[im 16/89]
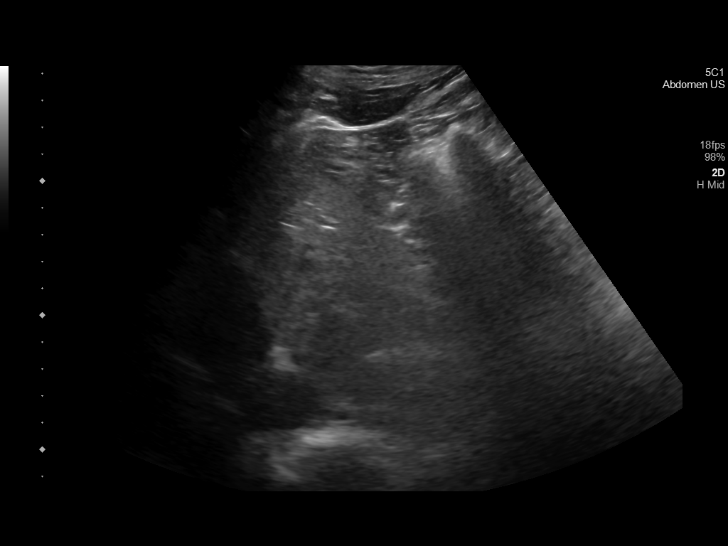
[im 23/89]
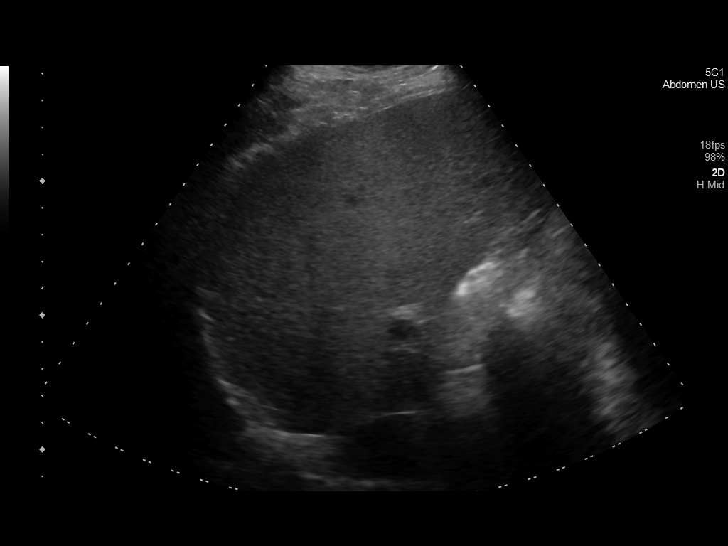
[im 31/89]
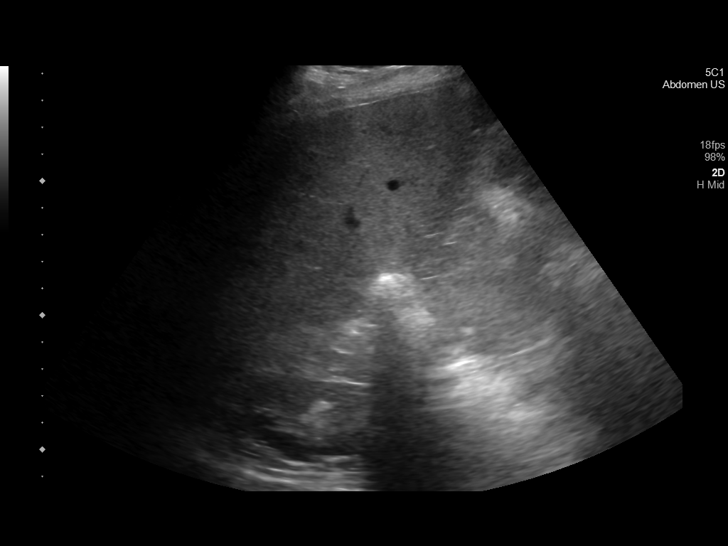
[im 35/89]
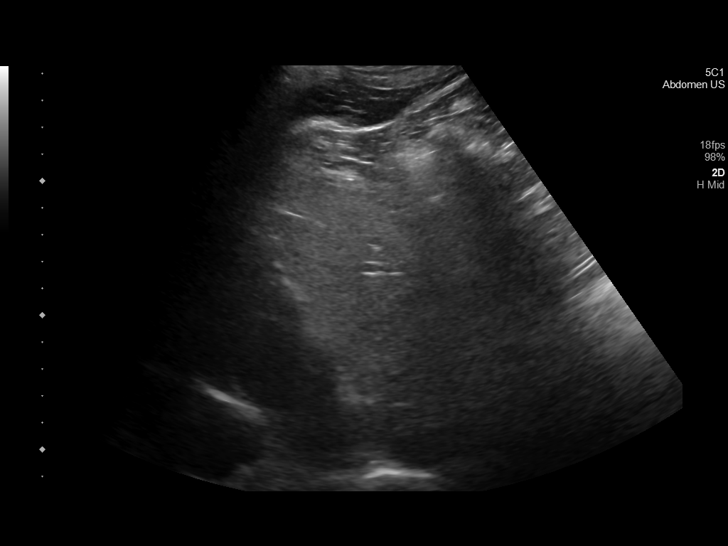
[im 43/89]
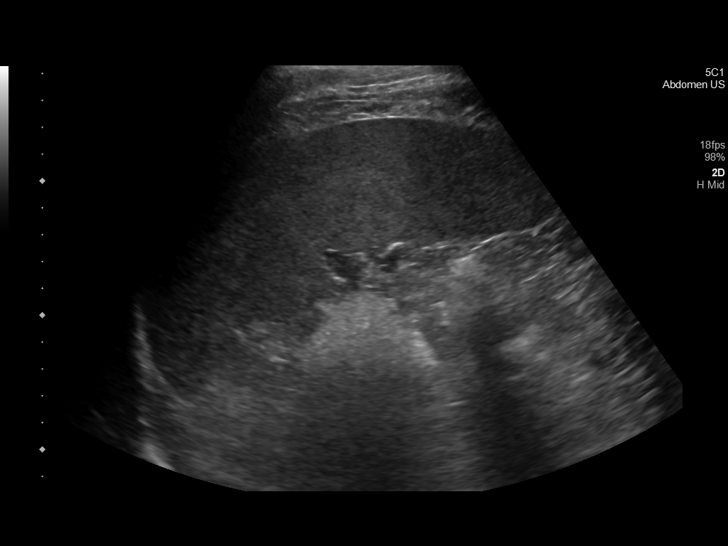
[im 50/89]
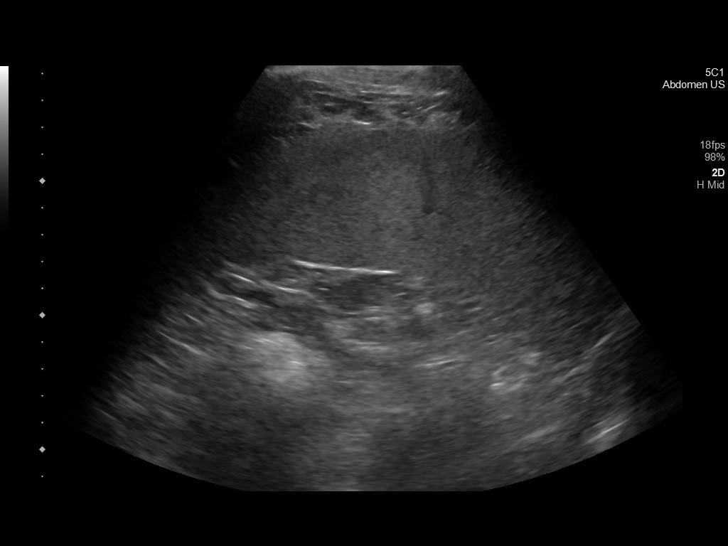
[im 58/89]
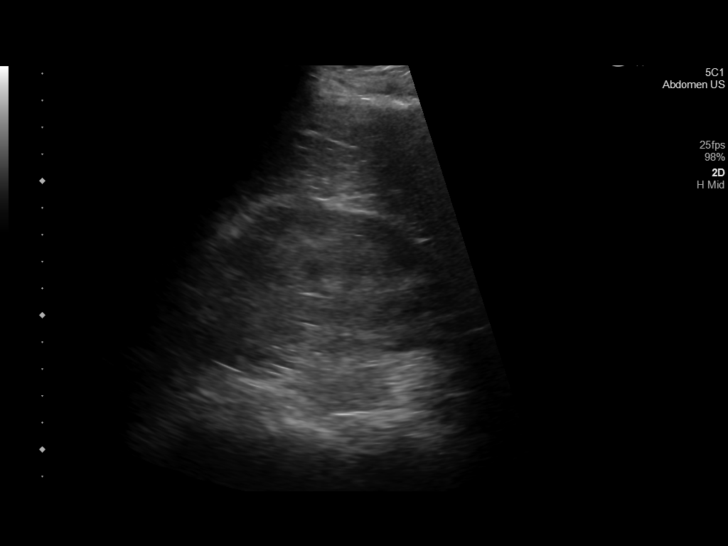
[im 62/89]
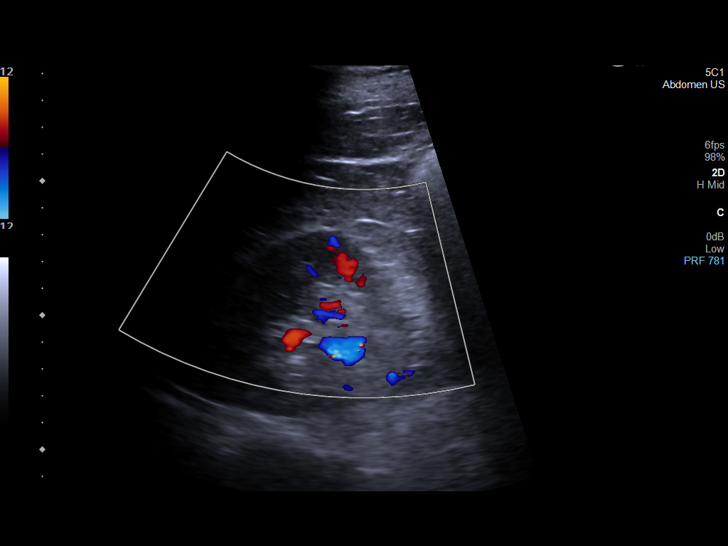
[im 69/89]
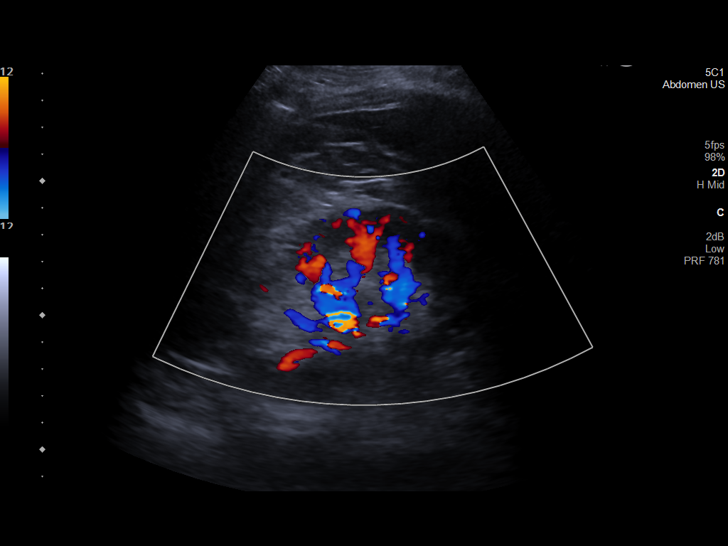
[im 77/89]
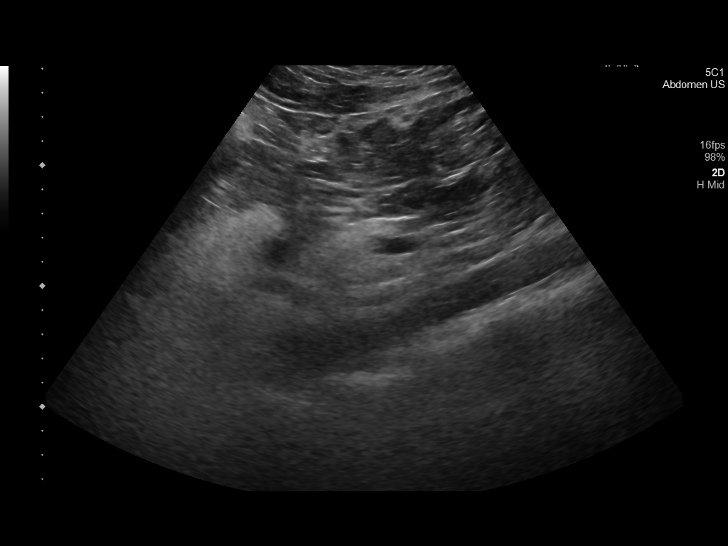
[im 85/89]
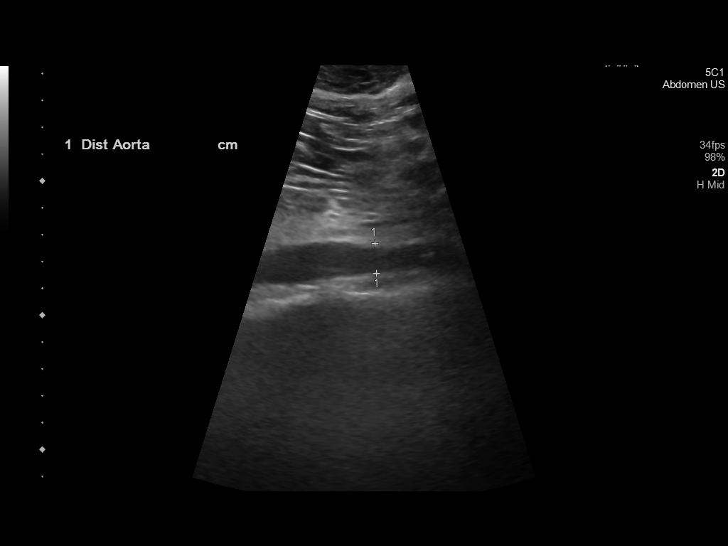

[Series 1001: abdomen us · 1 of 1 slices shown]
[im 1/1]
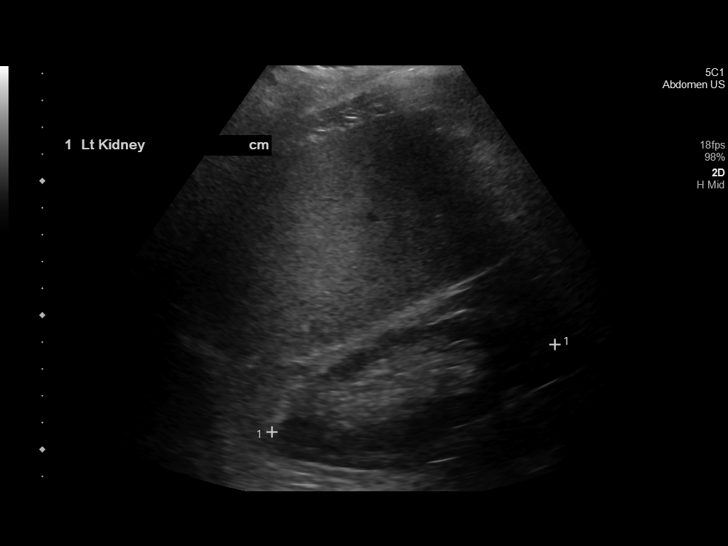

[14 of 25 positions shown; findings below may reference images not displayed]

FINDINGS: Gallbladder: No gallstones or wall thickening visualized. No
sonographic Murphy sign noted by sonographer.

Common bile duct: Diameter: 4 mm

Liver: No focal lesion identified. Slightly increased parenchymal
echogenicity. Portal vein is patent on color Doppler imaging with
normal direction of blood flow towards the liver.

IVC: No abnormality visualized.

Pancreas: Visualized portion unremarkable.

Spleen: Similar splenomegaly with a volume of 865 cc previously 880.

Right Kidney: Length: 12.3 cm. Echogenicity within normal limits. No
mass or hydronephrosis visualized.

Left Kidney: Length: 11 cm. Echogenicity within normal limits.
cm left upper pole renal cyst. Hydronephrosis visualized.

Abdominal aorta: No aneurysm visualized.

Other findings: None.
IMPRESSION: Similar splenomegaly.
